# Patient Record
Sex: Male | Born: 1950 | Race: White | Hispanic: No | Marital: Married | Smoking: Never smoker
Health system: Southern US, Community
[De-identification: ages and names within clinical notes are randomized; demographics above are authoritative.]

## PROBLEM LIST (undated history)

## (undated) DIAGNOSIS — U071 COVID-19: Secondary | ICD-10-CM

## (undated) HISTORY — DX: COVID-19: U07.1

## (undated) HISTORY — PX: BACK SURGERY: SHX140

---

## 1999-06-10 ENCOUNTER — Encounter: Admission: RE | Admit: 1999-06-10 | Discharge: 1999-06-10 | Payer: Self-pay | Admitting: *Deleted

## 2000-08-19 ENCOUNTER — Ambulatory Visit (HOSPITAL_COMMUNITY): Admission: RE | Admit: 2000-08-19 | Discharge: 2000-08-19 | Payer: Self-pay | Admitting: Neurosurgery

## 2000-08-19 ENCOUNTER — Encounter: Payer: Self-pay | Admitting: Neurosurgery

## 2007-07-13 ENCOUNTER — Encounter: Admission: RE | Admit: 2007-07-13 | Discharge: 2007-07-13 | Payer: Self-pay | Admitting: General Surgery

## 2007-07-14 ENCOUNTER — Ambulatory Visit (HOSPITAL_BASED_OUTPATIENT_CLINIC_OR_DEPARTMENT_OTHER): Admission: RE | Admit: 2007-07-14 | Discharge: 2007-07-14 | Payer: Self-pay | Admitting: General Surgery

## 2010-06-09 NOTE — Op Note (Signed)
NAMEHERVE, HAUG            ACCOUNT NO.:  [id number]   MEDICAL RECORD NO.:  [id number]          PATIENT TYPE:  AMB   LOCATION:  DSC                          FACILITY:  [facility]   PHYSICIAN:  Cherylynn Ridges, M.D.    DATE OF BIRTH:  06-Dec-1950   DATE OF PROCEDURE:  07/14/2007  DATE OF DISCHARGE:                               OPERATIVE REPORT   PREOPERATIVE DIAGNOSIS:  Bilateral inguinal hernias.   POSTOPERATIVE DIAGNOSIS:  Bilateral direct inguinal hernias with small  indirect components.   PROCEDURE:  Bilateral inguinal hernia repair with mesh.   SURGEON:  Marta Lamas. Lindie Spruce, MD   ANESTHESIA:  General with laryngeal airway.   ESTIMATED BLOOD LOSS:  Less than 20 mL.   COMPLICATIONS:  None.   CONDITION:  Stable.   FINDINGS:  Bilateral significant direct hernias with small indirect  sacs.   INDICATIONS FOR OPERATION:  The patient is a 60 year old with  symptomatic bilateral hernias who comes in for repair.   OPERATION:  The patient was taken to the operating room, placed on table  in supine position.  After an adequate general laryngeal airway  anesthetic was administered, he was prepped and draped in usual sterile  manner exposing bilateral inguinal areas.   We started off on the patient's left side where we made a transverse  incision at the level of the superficial ring approximately 5-6 cm long.  It was taken down to the subcutaneous tissue through Scarpa fascia down  to the external oblique fascia which was split along its fibers through  the superficial ring.  We mobilized the spermatic cord up on a work  bench and as we were doing so we could palpate the hernia defect bulging  out medial to the cord.  We placed the cord up on to a work bench where  we inspected on the anterior medial aspect for an indirect sac and there  was a small component or probably at least 1 cm in size which was able  to be isolated from the cord.  We ligated that at its base with 2 suture  ligatures of 0 Ethibond.  We then repaired the floor by first  imbricating the hernia sac on itself, the direct hernia sac on itself  using 0 Ethibond sutures, and then placing an oval piece of mesh of  polypropylene mesh to the floor attaching into the pubic tubercle to  reflect the portion of the inguinal ligament inferolaterally and the  conjoined tendon anteromedially.   The mesh was sewn in place using a running stitch of 0 Prolene.  The  ilioinguinal nerve was preserved on both sides.  Once we had the mesh in  place which had been soaked in antibiotic solution, we irrigated with  antibiotic solution and then closed the external oblique fascia on top  of the cord using a running 3-0 Vicryl suture.  Scarpa fascia was  reapproximated using interrupted 3-0 Vicryl.  We then injected 0.5%  Marcaine without epinephrine into the subcu and then closed the skin  using running subcuticular stitch of 4-0 Monocryl.  All counts were  correct on  the left side and the right side was repaired in an identical  manner with an identical finding of a small indirect sac which was  suture ligated at its base with two 0 Ethibond sutures.  The piece of  mesh measured 5 x 2 cm in size and was sewn in place with 0 Prolene  suture.  We irrigated with antibiotic solution and soaked the mesh and  solution and then closed in a similar manner.  The skin was closed with  running subcuticular 4-0 Monocryl on both sides and we applied  Dermabond, Steri-Strips, and Tegaderm.  All needle counts, sponge  counts, and instrument counts were correct.      Cherylynn Ridges, M.D.  Electronically Signed     JOW/MEDQ  D:  07/14/2007  T:  07/15/2007  Job:  [id number]

## 2010-10-22 LAB — COMPREHENSIVE METABOLIC PANEL
ALT: 23
AST: 28
Albumin: 4.2
Alkaline Phosphatase: 94
BUN: 11
CO2: 26
Calcium: 9.3
Chloride: 104
Creatinine, Ser: 0.89
GFR calc Af Amer: >60
GFR calc non Af Amer: >60
Glucose, Bld: 120 — ABNORMAL HIGH
Potassium: 3.9
Sodium: 137
Total Bilirubin: 0.9
Total Protein: 7

## 2010-10-22 LAB — DIFFERENTIAL
Eosinophils Absolute: 0.1
Lymphs Abs: 3
Monocytes Relative: 8
Neutro Abs: 6.3
Neutrophils Relative %: 61

## 2010-10-22 LAB — CBC
HCT: 44
Hemoglobin: 15.1
MCHC: 34.2
MCV: 97.2
Platelets: 243
RBC: 4.52
RDW: 13.5
WBC: 10.3

## 2010-10-22 LAB — POCT HEMOGLOBIN-HEMACUE: Hemoglobin: 16.3

## 2012-11-27 ENCOUNTER — Other Ambulatory Visit: Payer: Self-pay | Admitting: Family Medicine

## 2012-11-27 DIAGNOSIS — M545 Low back pain, unspecified: Secondary | ICD-10-CM

## 2012-11-29 ENCOUNTER — Ambulatory Visit
Admission: RE | Admit: 2012-11-29 | Discharge: 2012-11-29 | Disposition: A | Discharge status: Home / Self Care | Payer: BC Managed Care – PPO | Source: Ambulatory Visit | Attending: Family Medicine | Admitting: Family Medicine

## 2012-11-29 DIAGNOSIS — M545 Low back pain: Secondary | ICD-10-CM

## 2016-03-09 ENCOUNTER — Encounter (HOSPITAL_COMMUNITY): Payer: Self-pay | Admitting: Emergency Medicine

## 2016-03-09 ENCOUNTER — Observation Stay (HOSPITAL_COMMUNITY)
Admission: EM | Admit: 2016-03-09 | Discharge: 2016-03-12 | Disposition: A | Discharge status: Home / Self Care | Payer: BLUE CROSS/BLUE SHIELD | Attending: Internal Medicine | Admitting: Internal Medicine

## 2016-03-09 DIAGNOSIS — D509 Iron deficiency anemia, unspecified: Secondary | ICD-10-CM | POA: Diagnosis not present

## 2016-03-09 DIAGNOSIS — M109 Gout, unspecified: Secondary | ICD-10-CM | POA: Insufficient documentation

## 2016-03-09 DIAGNOSIS — K2950 Unspecified chronic gastritis without bleeding: Secondary | ICD-10-CM | POA: Insufficient documentation

## 2016-03-09 DIAGNOSIS — G8929 Other chronic pain: Secondary | ICD-10-CM | POA: Insufficient documentation

## 2016-03-09 DIAGNOSIS — Z720 Tobacco use: Secondary | ICD-10-CM | POA: Insufficient documentation

## 2016-03-09 DIAGNOSIS — K3189 Other diseases of stomach and duodenum: Secondary | ICD-10-CM | POA: Diagnosis not present

## 2016-03-09 DIAGNOSIS — D649 Anemia, unspecified: Secondary | ICD-10-CM | POA: Diagnosis not present

## 2016-03-09 DIAGNOSIS — R634 Abnormal weight loss: Secondary | ICD-10-CM | POA: Diagnosis not present

## 2016-03-09 DIAGNOSIS — M549 Dorsalgia, unspecified: Secondary | ICD-10-CM | POA: Diagnosis not present

## 2016-03-09 DIAGNOSIS — Z791 Long term (current) use of non-steroidal anti-inflammatories (NSAID): Secondary | ICD-10-CM | POA: Insufficient documentation

## 2016-03-09 DIAGNOSIS — Z7982 Long term (current) use of aspirin: Secondary | ICD-10-CM | POA: Diagnosis not present

## 2016-03-09 DIAGNOSIS — M7989 Other specified soft tissue disorders: Secondary | ICD-10-CM | POA: Diagnosis not present

## 2016-03-09 LAB — CBC WITH DIFFERENTIAL/PLATELET
BASOS ABS: 0.1 10*3/uL (ref 0.0–0.1)
BASOS PCT: 1 %
EOS ABS: 1 10*3/uL — AB (ref 0.0–0.7)
EOS PCT: 10 %
HCT: 22.8 % — ABNORMAL LOW (ref 39.0–52.0)
Hemoglobin: 6.8 g/dL — CL (ref 13.0–17.0)
Lymphocytes Relative: 27 %
Lymphs Abs: 2.9 10*3/uL (ref 0.7–4.0)
MCH: 23.4 pg — ABNORMAL LOW (ref 26.0–34.0)
MCHC: 29.8 g/dL — ABNORMAL LOW (ref 30.0–36.0)
MCV: 78.4 fL (ref 78.0–100.0)
Monocytes Absolute: 0.9 10*3/uL (ref 0.1–1.0)
Monocytes Relative: 8 %
Neutro Abs: 5.7 10*3/uL (ref 1.7–7.7)
Neutrophils Relative %: 54 %
PLATELETS: 415 10*3/uL — AB (ref 150–400)
RBC: 2.91 MIL/uL — AB (ref 4.22–5.81)
RDW: 15 % (ref 11.5–15.5)
WBC: 10.4 10*3/uL (ref 4.0–10.5)

## 2016-03-09 LAB — COMPREHENSIVE METABOLIC PANEL
ALT: 20 U/L (ref 17–63)
AST: 26 U/L (ref 15–41)
Albumin: 2.7 g/dL — ABNORMAL LOW (ref 3.5–5.0)
Alkaline Phosphatase: 55 U/L (ref 38–126)
Anion gap: 4 — ABNORMAL LOW (ref 5–15)
BUN: 18 mg/dL (ref 6–20)
CHLORIDE: 109 mmol/L (ref 101–111)
CO2: 25 mmol/L (ref 22–32)
CREATININE: 0.81 mg/dL (ref 0.61–1.24)
Calcium: 8 mg/dL — ABNORMAL LOW (ref 8.9–10.3)
GFR calc Af Amer: >60 mL/min (ref >60)
GFR calc non Af Amer: >60 mL/min (ref >60)
Glucose, Bld: 109 mg/dL — ABNORMAL HIGH (ref 65–99)
Potassium: 3.9 mmol/L (ref 3.5–5.1)
SODIUM: 138 mmol/L (ref 135–145)
Total Bilirubin: 0.5 mg/dL (ref 0.3–1.2)
Total Protein: 4.6 g/dL — ABNORMAL LOW (ref 6.5–8.1)

## 2016-03-09 LAB — PREPARE RBC (CROSSMATCH)

## 2016-03-09 LAB — I-STAT TROPONIN, ED: TROPONIN I, POC: 0 ng/mL (ref 0.00–0.08)

## 2016-03-09 LAB — BRAIN NATRIURETIC PEPTIDE: B Natriuretic Peptide: 42.3 pg/mL (ref 0.0–100.0)

## 2016-03-09 LAB — ABO/RH: ABO/RH(D): O NEG

## 2016-03-09 LAB — POC OCCULT BLOOD, ED: Fecal Occult Bld: NEGATIVE

## 2016-03-09 MED ORDER — ACETAMINOPHEN 325 MG PO TABS: 650.0000 mg | ORAL_TABLET | Freq: Four times a day (QID) | ORAL | Status: DC | PRN

## 2016-03-09 MED ORDER — ALBUTEROL SULFATE (2.5 MG/3ML) 0.083% IN NEBU: 2.5000 mg | INHALATION_SOLUTION | RESPIRATORY_TRACT | Status: DC | PRN

## 2016-03-09 MED ORDER — ONDANSETRON HCL 4 MG/2ML IJ SOLN
4.0000 mg | Freq: Four times a day (QID) | INTRAMUSCULAR | Status: DC | PRN
  Filled 2016-03-09: qty 2

## 2016-03-09 MED ORDER — SODIUM CHLORIDE 0.9 % IV SOLN
Freq: Once | INTRAVENOUS | Status: AC
Start: 2016-03-09 — End: 2016-03-09
  Administered 2016-03-09: 18:00:00 via INTRAVENOUS

## 2016-03-09 MED ORDER — PANTOPRAZOLE SODIUM 40 MG PO TBEC
40.0000 mg | DELAYED_RELEASE_TABLET | Freq: Two times a day (BID) | ORAL | Status: DC
  Administered 2016-03-10 – 2016-03-12 (×5): 40 mg via ORAL
  Filled 2016-03-09 (×5): qty 1

## 2016-03-09 MED ORDER — ONDANSETRON HCL 4 MG PO TABS: 4.0000 mg | ORAL_TABLET | Freq: Four times a day (QID) | ORAL | Status: DC | PRN

## 2016-03-09 MED ORDER — TRAMADOL HCL 50 MG PO TABS
50.0000 mg | ORAL_TABLET | Freq: Four times a day (QID) | ORAL | Status: DC | PRN
  Administered 2016-03-11: 50 mg via ORAL
  Filled 2016-03-09: qty 1

## 2016-03-09 MED ORDER — ACETAMINOPHEN 650 MG RE SUPP: 650.0000 mg | Freq: Four times a day (QID) | RECTAL | Status: DC | PRN

## 2016-03-09 MED ORDER — PANTOPRAZOLE SODIUM 40 MG IV SOLR
40.0000 mg | Freq: Once | INTRAVENOUS | Status: AC
  Administered 2016-03-09: 40 mg via INTRAVENOUS
  Filled 2016-03-09: qty 40

## 2016-03-09 NOTE — ED Notes (Signed)
Pt transported to XR.  

## 2016-03-09 NOTE — ED Triage Notes (Addendum)
Pt st's he was seen by his MD yesterday for swelling and fatigue.  St's blood was drawn.  Pt st's he was called today and was told to come to ED ref. Low hgb, Pt st's he is not on any blood thinners but does take a [employer] everyday

## 2016-03-09 NOTE — Progress Notes (Signed)
Patient admitted from ED to room 6N17. Alert and oriented x4 .Denies pain. VS stable. Wife at bedside. Oriented to room and call bell.

## 2016-03-09 NOTE — ED Provider Notes (Signed)
[facility] DEPT Provider Note   CSN: [id number] Arrival date & time: 03/09/16  1631     History   Chief Complaint Chief Complaint  Patient presents with  . Abnormal Lab    HPI Erik Shields is a 66 y.o. male.  HPI Presents after incidentally found to have a low hemoglobin. Patient states he's been taking Goody powders for about 5 months almost every day for random pains including headaches or joint pains. He has no history of liver disease or alcoholism. He has not passed out but has been feeling short of breath for the last month. He denies any chest pain. Denies any rectal bleeding or black stool. Denies any hematuria. He is not on anticoagulation. Denies any abdominal pain. He does have a history of hemorrhoids but has not had trouble with this recently. Denies any significant cardiac history. Symptoms are not particularly worse today.  History reviewed. No pertinent past medical history.  Patient Active Problem List   Diagnosis Date Noted  . Symptomatic anemia 03/09/2016    Past Surgical History:  Procedure Laterality Date  . BACK SURGERY         Home Medications    Prior to Admission medications   Medication Sig Start Date End Date Taking? Authorizing Provider  aspirin EC 81 MG tablet Take 81 mg by mouth at bedtime.   Yes Historical Provider, MD  Aspirin-Acetaminophen-Caffeine (GOODY HEADACHE PO) Take 1 packet by mouth daily.    Yes Historical Provider, MD  diclofenac (VOLTAREN) 75 MG EC tablet Take 75 mg by mouth 2 (two) times daily as needed (for elbow pain).  01/09/16  Yes Historical Provider, MD  oxyCODONE-acetaminophen (PERCOCET) 10-325 MG tablet Take 0.5 tablets by mouth 4 (four) times daily as needed for pain.  02/08/16  Yes Historical Provider, MD  polyethylene glycol powder (GLYCOLAX/MIRALAX) powder Take 17 g by mouth at bedtime.   Yes Historical Provider, MD  traMADol (ULTRAM) 50 MG tablet Take 1 tablet by mouth 2 (two) times daily as needed (for  elbow pain).  02/17/16  Yes Historical Provider, MD    Family History No family history on file.  Social History Social History  Substance Use Topics  . Smoking status: Never Smoker  . Smokeless tobacco: Current User    Types: Chew  . Alcohol use No     Allergies   Patient has no known allergies.   Review of Systems Review of Systems  Constitutional: Negative for fever.  Genitourinary: Negative for hematuria.  Allergic/Immunologic: Negative for immunocompromised state.  All other systems reviewed and are negative.    Physical Exam Updated Vital Signs BP 116/73 (BP Location: Left Arm)   Pulse 69   Temp 98.2 F (36.8 C) (Oral)   Resp 16   Ht 5\' 9"  (1.753 m)   Wt 72.6 kg   SpO2 100%   BMI 23.63 kg/m   Physical Exam  Constitutional: He appears well-developed and well-nourished. No distress.  Very pale  HENT:  Head: Normocephalic and atraumatic.  Left Ear: External ear normal.  Eyes: Conjunctivae are normal. Pupils are equal, round, and reactive to light. Right eye exhibits no discharge. Left eye exhibits no discharge.  Neck: Normal range of motion. Neck supple.  Cardiovascular: Normal rate and regular rhythm.   No murmur heard. Pulmonary/Chest: Effort normal and breath sounds normal. No respiratory distress.  Abdominal: Soft. Bowel sounds are normal. He exhibits no distension and no mass. There is no tenderness. There is no rebound and no guarding.  Genitourinary: Rectum normal. Rectal exam shows guaiac negative stool.  Genitourinary Comments: Small stool sample that is light brown  Musculoskeletal: He exhibits no edema.  Neurological: He is alert.  Skin: Skin is warm. He is not diaphoretic.  Psychiatric: He has a normal mood and affect.     ED Treatments / Results  Labs (all labs ordered are listed, but only abnormal results are displayed) Labs Reviewed  CBC WITH DIFFERENTIAL/PLATELET - Abnormal; Notable for the following:       Result Value   RBC 2.91  (*)    Hemoglobin 6.8 (*)    HCT 22.8 (*)    MCH 23.4 (*)    MCHC 29.8 (*)    Platelets 415 (*)    Eosinophils Absolute 1.0 (*)    All other components within normal limits  COMPREHENSIVE METABOLIC PANEL - Abnormal; Notable for the following:    Glucose, Bld 109 (*)    Calcium 8.0 (*)    Total Protein 4.6 (*)    Albumin 2.7 (*)    Anion gap 4 (*)    All other components within normal limits  BRAIN NATRIURETIC PEPTIDE  CBC  BASIC METABOLIC PANEL  POC OCCULT BLOOD, ED  I-STAT TROPOININ, ED  TYPE AND SCREEN  PREPARE RBC (CROSSMATCH)  ABO/RH    EKG  EKG Interpretation  Date/Time:  Tuesday March 09 2016 18:28:54 EST Ventricular Rate:  64 PR Interval:    QRS Duration: 89 QT Interval:  406 QTC Calculation: 419 R Axis:   63 Text Interpretation:  Sinus rhythm No significant change since last tracing Confirmed by Ethelda ChickJACUBOWITZ  MD, SAM [id number]) on 03/09/2016 6:33:16 PM       Radiology No results found.  Procedures Procedures (including critical care time)  Medications Ordered in ED Medications  pantoprazole (PROTONIX) injection 40 mg (not administered)  ondansetron (ZOFRAN) tablet 4 mg (not administered)    Or  ondansetron (ZOFRAN) injection 4 mg (not administered)  acetaminophen (TYLENOL) tablet 650 mg (not administered)    Or  acetaminophen (TYLENOL) suppository 650 mg (not administered)  traMADol (ULTRAM) tablet 50 mg (not administered)  albuterol (PROVENTIL) (2.5 MG/3ML) 0.083% nebulizer solution 2.5 mg (not administered)  pantoprazole (PROTONIX) EC tablet 40 mg (not administered)  0.9 %  sodium chloride infusion ( Intravenous Stopped 03/09/16 1938)     Initial Impression / Assessment and Plan / ED Course  I have reviewed the triage vital signs and the nursing notes.  Pertinent labs & imaging results that were available during my care of the patient were reviewed by me and considered in my medical decision making (see chart for details).     Patient appears  to have a new onset of critical anemia. Transfusion ordered. EKG, troponin without evidence of ACS. BMP negative. Point of care Hemoccult is negative with adequate stool sample but possible false negative. Patient is not any anticoagulation. Will admit patient for further evaluation and monitoring.  Final Clinical Impressions(s) / ED Diagnoses   Final diagnoses:  None  Symptomatic Anemia  New Prescriptions Current Discharge Medication List       Sidney AceAlison Charruf Tamara Monteith, MD 03/09/16 [id number]    Doug SouSam Jacubowitz, MD 03/10/16 [id number]

## 2016-03-09 NOTE — ED Notes (Signed)
CRITICAL VALUE ALERT  Critical value received:  Hgb 6.8  Date of notification:  03/09/2016   Time of notification: 1705  Critical value read back:Yes.    Nurse who received alert:  Heide GuileHope Jamilla Galli  MD notified (1st page):  Dr. Patria Maneampos Pt moved back to room   Time of first page:  1707

## 2016-03-09 NOTE — H&P (Signed)
History and Physical    Erik Shields ZOX:[id number] DOB: 1950-02-03 DOA: 03/09/2016  Referring MD/NP/PA: Dr. Franklyn Loruch [facility]  Patient coming from: Home  Chief Complaint: abnormal lab work  HPI: Erik CalClarence E Shields is a 66 y.o. male with medical history significant of  DDD, gout, and chronic back pain; who presents for abnormal lab work. For the last 1 month patient notes that he's been easily fatigued with even minimal exertion. For the last week she has had associated symptoms of lower leg swelling. Associated symptoms include shortness of breath. Seen by primary care doctor yesterday for these symptoms and blood work was taken. The PCP office called him today for him to come to the emergency department to be transfused 2 units of blood as his hemoglobin was noted to be in the 6.9 g/dL. He admits to daily use of Goody powders reporting anywhere from 1-2 packets per day for at least the past 5 months due to back and joint pain. Patient denies any bloody stools, loss of consciousness, nausea, vomiting, diarrhea, abdominal pain, chest pain, or cough. Patient notes that he's had a colonoscopy with in the last year [place] [address] He thinks he was told that he had diverticulosis.   ED Course: Admission to the emergency department patient was seen to have vitals relatively within normal limits. Lab work revealed a hemoglobin of 6.8. Patient was ordered to transfuse 1 unit of packed red blood cells. [facility] called to admit.  Review of Systems: As per HPI otherwise 10 point review of systems negative.   History reviewed. No pertinent past medical history.  Past Surgical History:  Procedure Laterality Date  . BACK SURGERY       reports that he has never smoked. His smokeless tobacco use includes Chew. He reports that he does not drink alcohol or use drugs.  Allergies not on file  No family history on file.  Prior to Admission medications     Not on File    Physical Exam:   Constitutional: NAD, calm, comfortable Vitals:   03/09/16 1647 03/09/16 1648 03/09/16 1821 03/09/16 1822  BP: 116/73  115/75   Pulse: 69   63  Resp: 16     Temp: 98.2 F (36.8 C)     TempSrc: Oral     SpO2: 100%   100%  Weight:  72.6 kg (160 lb)    Height:  5\' 9"  (1.753 m)     Eyes: PERRL, lids and conjunctivae normal ENMT: Mucous membranes are moist. Posterior pharynx clear of any exudate or lesions.Normal dentition.  Neck: normal, supple, no masses, no thyromegaly Respiratory: clear to auscultation bilaterally, no wheezing, no crackles. Normal respiratory effort. No accessory muscle use.  Cardiovascular: Regular rate and rhythm, no murmurs / rubs / gallops. No extremity edema. 2+ pedal pulses. No carotid bruits.  Abdomen: no tenderness, no masses palpated. No hepatosplenomegaly. Bowel sounds positive.  Musculoskeletal: no clubbing / cyanosis. No joint deformity upper and lower extremities. Good ROM, no contractures. Normal muscle tone.  Skin: no rashes, lesions, ulcers. No induration Neurologic: CN 2-12 grossly intact. Sensation intact, DTR normal. Strength 5/5 in all 4.  Psychiatric: Normal judgment and insight. Alert and oriented x 3. Normal mood.     Labs on Admission: I have personally reviewed following labs and imaging studies  CBC:  Recent Labs Lab 03/09/16 1651  WBC 10.4  NEUTROABS 5.7  HGB 6.8*  HCT 22.8*  MCV 78.4  PLT 415*  Basic Metabolic Panel:  Recent Labs Lab 03/09/16 1651  NA 138  K 3.9  CL 109  CO2 25  GLUCOSE 109*  BUN 18  CREATININE 0.81  CALCIUM 8.0*   GFR: Estimated Creatinine Clearance: 89.7 mL/min (by C-G formula based on SCr of 0.81 mg/dL). Liver Function Tests:  Recent Labs Lab 03/09/16 1651  AST 26  ALT 20  ALKPHOS 55  BILITOT 0.5  PROT 4.6*  ALBUMIN 2.7*   No results for input(s): LIPASE, AMYLASE in the last 168 hours. No results for input(s): AMMONIA in the last 168  hours. Coagulation Profile: No results for input(s): INR, PROTIME in the last 168 hours. Cardiac Enzymes: No results for input(s): CKTOTAL, CKMB, CKMBINDEX, TROPONINI in the last 168 hours. BNP (last 3 results) No results for input(s): PROBNP in the last 8760 hours. HbA1C: No results for input(s): HGBA1C in the last 72 hours. CBG: No results for input(s): GLUCAP in the last 168 hours. Lipid Profile: No results for input(s): CHOL, HDL, LDLCALC, TRIG, CHOLHDL, LDLDIRECT in the last 72 hours. Thyroid Function Tests: No results for input(s): TSH, T4TOTAL, FREET4, T3FREE, THYROIDAB in the last 72 hours. Anemia Panel: No results for input(s): VITAMINB12, FOLATE, FERRITIN, TIBC, IRON, RETICCTPCT in the last 72 hours. Urine analysis: No results found for: COLORURINE, APPEARANCEUR, LABSPEC, PHURINE, GLUCOSEU, HGBUR, BILIRUBINUR, KETONESUR, PROTEINUR, UROBILINOGEN, NITRITE, LEUKOCYTESUR Sepsis Labs: No results found for this or any previous visit (from the past 240 hour(s)).   Radiological Exams on Admission: No results found.  EKG: Independently reviewed. Sinus rhythm  Assessment/Plan Symptomatic anemia/GI bleed: Acute. Patient intial hemoglobin noted to be 6.8 on admission. Guiac stool negative. Patient does not appear to be actively bleeding at this time. He does reports history of diverticulosis and daily BC goody powder use. - Admit to a MedSurg bed - Continue with transfusion 1unit of PRBC  - Clear liquid diet - Recheck CBC in a.m  - Counseled on the need of the patient to refrain away from NSAIDs use - Determine if patient needs inpatient GI consultation in a.m.  Lower leg swelling: Bilaterally. Symptoms improved with elevation of lower extremities. - Consider need of compression stockings. DVT prophylaxis: SCD  Code Status: Full Family Communication: Discussed plan of care with the patient and family present at bedside Disposition Plan: Likely discharge home in a.m.   Consults  called: none Admission status: Observation  Clydie Braun MD [facility] Pager [phone]  If 7PM-7AM, please contact night-coverage [email or web address] Password [facility]  03/09/2016, 7:29 PM

## 2016-03-09 NOTE — ED Provider Notes (Signed)
Patient reports he has been increasingly tired and generalized weakness for the past 3 weeks. No definite hemoglobin of 6.8. Saw his primary care physician yesterday. Sent here for further evaluation patient is alert and in no distress lungs clear auscultation heart regular rate and rhythm abdomen nondistended nontender extremities without edema   Doug SouSam Levii [place], MD 03/10/16 [id number]

## 2016-03-10 DIAGNOSIS — D649 Anemia, unspecified: Secondary | ICD-10-CM

## 2016-03-10 DIAGNOSIS — M7989 Other specified soft tissue disorders: Secondary | ICD-10-CM | POA: Diagnosis not present

## 2016-03-10 LAB — FERRITIN: FERRITIN: 3 ng/mL — AB (ref 24–336)

## 2016-03-10 LAB — BASIC METABOLIC PANEL
ANION GAP: 5 (ref 5–15)
BUN: 10 mg/dL (ref 6–20)
CALCIUM: 8.1 mg/dL — AB (ref 8.9–10.3)
CO2: 25 mmol/L (ref 22–32)
Chloride: 112 mmol/L — ABNORMAL HIGH (ref 101–111)
Creatinine, Ser: 0.81 mg/dL (ref 0.61–1.24)
GFR calc Af Amer: >60 mL/min (ref >60)
GLUCOSE: 96 mg/dL (ref 65–99)
Potassium: 4 mmol/L (ref 3.5–5.1)
SODIUM: 142 mmol/L (ref 135–145)

## 2016-03-10 LAB — HEMOGLOBIN AND HEMATOCRIT, BLOOD
HCT: 26.1 % — ABNORMAL LOW (ref 39.0–52.0)
HCT: 29.8 % — ABNORMAL LOW (ref 39.0–52.0)
HEMOGLOBIN: 8.2 g/dL — AB (ref 13.0–17.0)
HEMOGLOBIN: 9.1 g/dL — AB (ref 13.0–17.0)

## 2016-03-10 LAB — CBC
HCT: 25.4 % — ABNORMAL LOW (ref 39.0–52.0)
Hemoglobin: 7.7 g/dL — ABNORMAL LOW (ref 13.0–17.0)
MCH: 24.1 pg — ABNORMAL LOW (ref 26.0–34.0)
MCHC: 30.3 g/dL (ref 30.0–36.0)
MCV: 79.6 fL (ref 78.0–100.0)
PLATELETS: 316 10*3/uL (ref 150–400)
RBC: 3.19 MIL/uL — ABNORMAL LOW (ref 4.22–5.81)
RDW: 15.2 % (ref 11.5–15.5)
WBC: 6.8 10*3/uL (ref 4.0–10.5)

## 2016-03-10 LAB — IRON AND TIBC
IRON: 52 ug/dL (ref 45–182)
Saturation Ratios: 14 % — ABNORMAL LOW (ref 17.9–39.5)
TIBC: 378 ug/dL (ref 250–450)
UIBC: 326 ug/dL

## 2016-03-10 LAB — LACTATE DEHYDROGENASE: LDH: 162 U/L (ref 98–192)

## 2016-03-10 LAB — RETICULOCYTES
RBC.: 3.28 MIL/uL — AB (ref 4.22–5.81)
RETIC CT PCT: 1.3 % (ref 0.4–3.1)
Retic Count, Absolute: 42.6 10*3/uL (ref 19.0–186.0)

## 2016-03-10 LAB — PREPARE RBC (CROSSMATCH)

## 2016-03-10 LAB — VITAMIN B12: VITAMIN B 12: 315 pg/mL (ref 180–914)

## 2016-03-10 LAB — FOLATE: Folate: 21 ng/mL (ref >5.9)

## 2016-03-10 MED ORDER — SODIUM CHLORIDE 0.9 % IV SOLN
Freq: Once | INTRAVENOUS | Status: AC
  Administered 2016-03-10: 10:00:00 via INTRAVENOUS

## 2016-03-10 NOTE — Progress Notes (Signed)
[facility]                                                                              Patient Demographics  Erik Shields, is a 66 y.o. male, DOB - [birth year], ZOX:[id number]  Admit date - 03/09/2016   Admitting Physician Clydie Braun, MD  Outpatient Primary MD for the patient is [facility]  Outpatient specialists:   LOS - 0  days    Chief Complaint  Patient presents with  . Abnormal Lab       Brief summary   Patient is a 66 year old male with history of DDD, gout, and chronic back pain presented to ED with abnormal lab work. Patient noted that for the last 1 month he was being easily fatigued with minimal exertion, lower leg swelling, dyspnea with exertion. Patient was seen by PCP and was called with a hemoglobin of 6.9 and was recommended to go to ED. Patient admitted to daily use of Goody powders 1-2 packets every day for the past 5 months with a daily aspirin. Hemoglobin was 6.8.   Assessment & Plan    Principal Problem:   Symptomatic anemia:Hemoglobin 6.8 at the time of admission - FOBT negative, - Status post 1 unit packed RBCs, hemoglobin 7.7. Colonoscopy last year per patient showed diverticulosis - Also reports more than 20 pounds weight loss, chronic abdominal pain for past 2-3 months, daily goody powder use, "pain pills" - Anemia panel showed iron 52, ferritin 3, LDH heptoglobin pending - GI consultation obtained, discussed with Dr. Elnoria Howard, will follow recommendations, currently on clear liquid diet - Continue PPI    Active Problems:   Swelling of lower extremity - Improved with elevation of the lower extremities, also third spacing and hypo-albuminemia with albumin of 2.7   Code Status: Full code  DVT Prophylaxis:  SCD's Family Communication: Discussed in detail with the patient, all imaging results, lab results explained to the patient  and wife  Disposition Plan: Pending GI evaluation  Time Spent  in minutes  25 minutes  Procedures:    Consultants:   GI, Dr Elnoria Howard   Antimicrobials:      Medications  Scheduled Meds: . sodium chloride   Intravenous Once  . pantoprazole  40 mg Oral BID   Continuous Infusions: PRN Meds:.acetaminophen **OR** acetaminophen, albuterol, ondansetron **OR** ondansetron (ZOFRAN) IV, traMADol   Antibiotics   Anti-infectives    None        Subjective:   Erik Shields was seen and examined today. Currently denies any GI bleeding, hematochezia or melena.  Patient denies dizziness, chest pain, shortness of breath, abdominal pain, N/V/D/C, new weakness, numbess, tingling. No acute events overnight.    Objective:   Vitals:   03/09/16 2045 03/09/16 2105 03/09/16 2315 03/10/16 0601  BP: 137/81 127/80 (!) 149/82 129/79  Pulse:  72 (!) 52 72  Resp:  19 18 19   Temp:  98.2 F (36.8 C) 98 F (36.7 C) 98.2 F (36.8 C)  TempSrc:  Oral Oral Oral  SpO2:  100% 100% 100%  Weight:      Height:  Intake/Output Summary (Last 24 hours) at 03/10/16 1222 Last data filed at 03/09/16 2315  Gross per 24 hour  Intake             1670 ml  Output                0 ml  Net             1670 ml     Wt Readings from Last 3 Encounters:  03/09/16 72.6 kg (160 lb)     Exam  General: Alert and oriented x 3, NAD  HEENT:  PERRLA, EOMI, Anicteric Sclera, mucous membranes moist.   Neck: Supple, no JVD, no masses  Cardiovascular: S1 S2 auscultated, no rubs, murmurs or gallops. Regular rate and rhythm.  Respiratory: Clear to auscultation bilaterally, no wheezing, rales or rhonchi  Gastrointestinal: Soft, nontender, nondistended, + bowel sounds  Ext: no cyanosis clubbing or edema  Neuro: AAOx3, Cr N's II- XII. Strength 5/5 upper and lower extremities bilaterally  Skin: No rashes  Psych: Normal affect and demeanor, alert and oriented x3    Data Reviewed:  I have personally reviewed following labs and imaging studies  Micro Results No  results found for this or any previous visit (from the past 240 hour(s)).  Radiology Reports No results found.  Lab Data:  CBC:  Recent Labs Lab 03/09/16 1651 03/10/16 0534  WBC 10.4 6.8  NEUTROABS 5.7  --   HGB 6.8* 7.7*  HCT 22.8* 25.4*  MCV 78.4 79.6  PLT 415* 316   Basic Metabolic Panel:  Recent Labs Lab 03/09/16 1651 03/10/16 0534  NA 138 142  K 3.9 4.0  CL 109 112*  CO2 25 25  GLUCOSE 109* 96  BUN 18 10  CREATININE 0.81 0.81  CALCIUM 8.0* 8.1*   GFR: Estimated Creatinine Clearance: 89.7 mL/min (by C-G formula based on SCr of 0.81 mg/dL). Liver Function Tests:  Recent Labs Lab 03/09/16 1651  AST 26  ALT 20  ALKPHOS 55  BILITOT 0.5  PROT 4.6*  ALBUMIN 2.7*   No results for input(s): LIPASE, AMYLASE in the last 168 hours. No results for input(s): AMMONIA in the last 168 hours. Coagulation Profile: No results for input(s): INR, PROTIME in the last 168 hours. Cardiac Enzymes: No results for input(s): CKTOTAL, CKMB, CKMBINDEX, TROPONINI in the last 168 hours. BNP (last 3 results) No results for input(s): PROBNP in the last 8760 hours. HbA1C: No results for input(s): HGBA1C in the last 72 hours. CBG: No results for input(s): GLUCAP in the last 168 hours. Lipid Profile: No results for input(s): CHOL, HDL, LDLCALC, TRIG, CHOLHDL, LDLDIRECT in the last 72 hours. Thyroid Function Tests: No results for input(s): TSH, T4TOTAL, FREET4, T3FREE, THYROIDAB in the last 72 hours. Anemia Panel:  Recent Labs  03/10/16 0648  VITAMINB12 315  FOLATE 21.0  FERRITIN 3*  TIBC 378  IRON 52  RETICCTPCT 1.3   Urine analysis: No results found for: COLORURINE, APPEARANCEUR, LABSPEC, PHURINE, GLUCOSEU, HGBUR, BILIRUBINUR, KETONESUR, PROTEINUR, UROBILINOGEN, NITRITE, [place] PartyLEUKOCYTESUR   Everlena Mackley M.D. [facility] 03/10/2016, 12:22 PM  Pager: [phone] Between 7am to 7pm - call Pager - [phone]  After 7pm go to [email or web address] - password [email or web address]  Call  night coverage person covering after 7pm

## 2016-03-10 NOTE — Consult Note (Addendum)
Reason for Consult: Symptomatic anemia Referring Physician: [facility]  Lenn Cal HPI: The patient was evaluated by his PCP for complaints of fatigue with minor exertion.  Blood work revealed that his HGB was at 6.9 g/dL and he was instructed to go to the ER for further evaluation and transfusion.  He states that he was using Goody's Powder for the past 5 months for back pain, but he denied any issues with hematochezia, melena, or hematemesis.  A colonoscopy was performed at the office on 07/03/2015 with findings of left sided diverticula.  His wife states that he has lost 20 lbs since last summer.  She reports that they reported this issue to his PCP, but no further work up was pursued.  Currently the patient minimizes his symptoms of weight loss and abdominal pain.  He does report some upper abdominal pain associated with PO intake.    History reviewed. No pertinent past medical history.  Past Surgical History:  Procedure Laterality Date  . BACK SURGERY      No family history on file.  Social History:  reports that he has never smoked. His smokeless tobacco use includes Chew. He reports that he does not drink alcohol or use drugs.  Allergies: No Known Allergies  Medications:  Scheduled: . sodium chloride   Intravenous Once  . pantoprazole  40 mg Oral BID   Continuous:   Results for orders placed or performed during the hospital encounter of 03/09/16 (from the past 24 hour(s))  CBC with Differential     Status: Abnormal   Collection Time: 03/09/16  4:51 PM  Result Value Ref Range   WBC 10.4 4.0 - 10.5 K/uL   RBC 2.91 (L) 4.22 - 5.81 MIL/uL   Hemoglobin 6.8 (LL) 13.0 - 17.0 g/dL   HCT 40.9 (L) 81.1 - 91.4 %   MCV 78.4 78.0 - 100.0 fL   MCH 23.4 (L) 26.0 - 34.0 pg   MCHC 29.8 (L) 30.0 - 36.0 g/dL   RDW 78.2 95.6 - 21.3 %   Platelets 415 (H) 150 - 400 K/uL   Neutrophils Relative % 54 %   Neutro Abs 5.7 1.7 - 7.7 K/uL   Lymphocytes Relative 27 %   Lymphs Abs 2.9  0.7 - 4.0 K/uL   Monocytes Relative 8 %   Monocytes Absolute 0.9 0.1 - 1.0 K/uL   Eosinophils Relative 10 %   Eosinophils Absolute 1.0 (H) 0.0 - 0.7 K/uL   Basophils Relative 1 %   Basophils Absolute 0.1 0.0 - 0.1 K/uL  Comprehensive metabolic panel     Status: Abnormal   Collection Time: 03/09/16  4:51 PM  Result Value Ref Range   Sodium 138 135 - 145 mmol/L   Potassium 3.9 3.5 - 5.1 mmol/L   Chloride 109 101 - 111 mmol/L   CO2 25 22 - 32 mmol/L   Glucose, Bld 109 (H) 65 - 99 mg/dL   BUN 18 6 - 20 mg/dL   Creatinine, Ser 0.86 0.61 - 1.24 mg/dL   Calcium 8.0 (L) 8.9 - 10.3 mg/dL   Total Protein 4.6 (L) 6.5 - 8.1 g/dL   Albumin 2.7 (L) 3.5 - 5.0 g/dL   AST 26 15 - 41 U/L   ALT 20 17 - 63 U/L   Alkaline Phosphatase 55 38 - 126 U/L   Total Bilirubin 0.5 0.3 - 1.2 mg/dL   GFR calc non Af Amer >60 >60 mL/min   GFR calc Af Amer >60 >60 mL/min  Anion gap 4 (L) 5 - 15  Type and screen [facility]     Status: None (Preliminary result)   Collection Time: 03/09/16  5:54 PM  Result Value Ref Range   ABO/RH(D) O NEG    Antibody Screen NEG    Sample Expiration 03/12/2016    Unit Number [id number]    Blood Component Type RED CELLS,LR    Unit division 00    Status of Unit ISSUED,FINAL    Transfusion Status OK TO TRANSFUSE    Crossmatch Result Compatible    Unit Number [id number]    Blood Component Type RED CELLS,LR    Unit division 00    Status of Unit ALLOCATED    Transfusion Status OK TO TRANSFUSE    Crossmatch Result Compatible   Prepare RBC     Status: None   Collection Time: 03/09/16  5:54 PM  Result Value Ref Range   Order Confirmation ORDER PROCESSED BY BLOOD BANK   ABO/Rh     Status: None   Collection Time: 03/09/16  5:54 PM  Result Value Ref Range   ABO/RH(D) O NEG   POC occult blood, ED     Status: None   Collection Time: 03/09/16  6:04 PM  Result Value Ref Range   Fecal Occult Bld NEGATIVE NEGATIVE  Brain natriuretic peptide     Status: None    Collection Time: 03/09/16  7:15 PM  Result Value Ref Range   B Natriuretic Peptide 42.3 0.0 - 100.0 pg/mL  I-Stat Troponin, ED ([facility])     Status: None   Collection Time: 03/09/16  7:40 PM  Result Value Ref Range   Troponin i, poc 0.00 0.00 - 0.08 ng/mL   Comment 3          CBC     Status: Abnormal   Collection Time: 03/10/16  5:34 AM  Result Value Ref Range   WBC 6.8 4.0 - 10.5 K/uL   RBC 3.19 (L) 4.22 - 5.81 MIL/uL   Hemoglobin 7.7 (L) 13.0 - 17.0 g/dL   HCT 30.825.4 (L) 65.739.0 - 84.652.0 %   MCV 79.6 78.0 - 100.0 fL   MCH 24.1 (L) 26.0 - 34.0 pg   MCHC 30.3 30.0 - 36.0 g/dL   RDW 96.215.2 95.211.5 - 84.115.5 %   Platelets 316 150 - 400 K/uL  Basic metabolic panel     Status: Abnormal   Collection Time: 03/10/16  5:34 AM  Result Value Ref Range   Sodium 142 135 - 145 mmol/L   Potassium 4.0 3.5 - 5.1 mmol/L   Chloride 112 (H) 101 - 111 mmol/L   CO2 25 22 - 32 mmol/L   Glucose, Bld 96 65 - 99 mg/dL   BUN 10 6 - 20 mg/dL   Creatinine, Ser 3.240.81 0.61 - 1.24 mg/dL   Calcium 8.1 (L) 8.9 - 10.3 mg/dL   GFR calc non Af Amer >60 >60 mL/min   GFR calc Af Amer >60 >60 mL/min   Anion gap 5 5 - 15  Ferritin     Status: Abnormal   Collection Time: 03/10/16  6:48 AM  Result Value Ref Range   Ferritin 3 (L) 24 - 336 ng/mL  Folate     Status: None   Collection Time: 03/10/16  6:48 AM  Result Value Ref Range   Folate 21.0 >5.9 ng/mL  Iron and TIBC     Status: Abnormal   Collection Time: 03/10/16  6:48 AM  Result  Value Ref Range   Iron 52 45 - 182 ug/dL   TIBC 161 096 - 045 ug/dL   Saturation Ratios 14 (L) 17.9 - 39.5 %   UIBC 326 ug/dL  Reticulocytes     Status: Abnormal   Collection Time: 03/10/16  6:48 AM  Result Value Ref Range   Retic Ct Pct 1.3 0.4 - 3.1 %   RBC. 3.28 (L) 4.22 - 5.81 MIL/uL   Retic Count, Manual 42.6 19.0 - 186.0 K/uL  Vitamin B12     Status: None   Collection Time: 03/10/16  6:48 AM  Result Value Ref Range   Vitamin B-12 315 180 - 914 pg/mL  Prepare RBC     Status:  None   Collection Time: 03/10/16  9:24 AM  Result Value Ref Range   Order Confirmation ORDER PROCESSED BY BLOOD BANK      No results found.  ROS:  As stated above in the HPI otherwise negative.  Blood pressure 129/79, pulse 72, temperature 98.2 F (36.8 C), temperature source Oral, resp. rate 19, height 5\' 9"  (1.753 m), weight 72.6 kg (160 lb), SpO2 100 %.    PE: Gen: NAD, Alert and Oriented HEENT:  [place]/AT, EOMI Neck: Supple, no LAD Lungs: CTA Bilaterally CV: RRR without M/G/R ABM: Soft, NTND, +BS Ext: No C/C/E  Assessment/Plan: 1) Anemia. 2) NSAID use. 3) Back pain. 4) Weight loss.   With his use of NSAIDs on a consistent basis, his anemia, and weight loss, I will pursue an EGD for further evaluation.    Plan: 1) EGD - Friday at 7:30 AM.  (Scheduling conflicts). 2) Follow HGB. 3) Continue with PPI.  Lisel Siegrist D 03/10/2016, 11:22 AM

## 2016-03-10 NOTE — Progress Notes (Signed)
Pt. States he feels fine/has no complaints since blood transfusion started. Will continue to monitor.

## 2016-03-11 DIAGNOSIS — M7989 Other specified soft tissue disorders: Secondary | ICD-10-CM | POA: Diagnosis not present

## 2016-03-11 DIAGNOSIS — D649 Anemia, unspecified: Secondary | ICD-10-CM | POA: Diagnosis not present

## 2016-03-11 LAB — TYPE AND SCREEN
ABO/RH(D): O NEG
ANTIBODY SCREEN: NEGATIVE
UNIT DIVISION: 0
UNIT DIVISION: 0

## 2016-03-11 LAB — HEMOGLOBIN AND HEMATOCRIT, BLOOD
HCT: 30.2 % — ABNORMAL LOW (ref 39.0–52.0)
Hemoglobin: 9.5 g/dL — ABNORMAL LOW (ref 13.0–17.0)

## 2016-03-11 LAB — HAPTOGLOBIN: Haptoglobin: 135 mg/dL (ref 34–200)

## 2016-03-11 MED ORDER — FERROUS GLUCONATE 324 (38 FE) MG PO TABS
324.0000 mg | ORAL_TABLET | Freq: Every day | ORAL | Status: DC
  Administered 2016-03-11 – 2016-03-12 (×2): 324 mg via ORAL
  Filled 2016-03-11 (×2): qty 1

## 2016-03-11 NOTE — Progress Notes (Signed)
[facility]                                                                              Patient Demographics  Erik Shields, is a 66 y.o. male, DOB - 05-21-1950, ZOX:[id number]  Admit date - 03/09/2016   Admitting Physician Clydie Braun, MD  Outpatient Primary MD for the patient is [facility]  Outpatient specialists:   LOS - 0  days    Chief Complaint  Patient presents with  . Abnormal Lab       Brief summary   Patient is a 66 year old male with history of DDD, gout, and chronic back pain presented to ED with abnormal lab work. Patient noted that for the last 1 month he was being easily fatigued with minimal exertion, lower leg swelling, dyspnea with exertion. Patient was seen by PCP and was called with a hemoglobin of 6.9 and was recommended to go to ED. Patient admitted to daily use of Goody powders 1-2 packets every day for the past 5 months with a daily aspirin. Hemoglobin was 6.8.   Assessment & Plan    Principal Problem:   Symptomatic anemia:Hemoglobin 6.8 at the time of admission - FOBT negative, - Status post 2 unit packed RBCs, hemoglobin 9.5. Colonoscopy last year per patient showed diverticulosis - Also reports more than 20 pounds weight loss, chronic abdominal pain for past 2-3 months, daily goody powder use, "pain pills" - Anemia panel showed iron 52, ferritin 3, LDH 162, haptoglobin 135 - GI consultation obtained, EGD planned 2/16. currently on clear liquid diet. NPO in am  - Continue PPI    Active Problems:   Swelling of lower extremity - Improved with elevation of the lower extremities, also third spacing and hypo-albuminemia with albumin of 2.7  Iron deficiency anemia - place on iron supplementation   Code Status: Full code  DVT Prophylaxis:  SCD's Family Communication: Discussed in detail with the patient, all imaging results, lab results explained to the patient  and wife  Disposition Plan:  Pending EGD Time Spent in minutes  25 minutes  Procedures:    Consultants:   GI, Dr Elnoria Howard   Antimicrobials:      Medications  Scheduled Meds: . pantoprazole  40 mg Oral BID   Continuous Infusions: PRN Meds:.acetaminophen **OR** acetaminophen, albuterol, ondansetron **OR** ondansetron (ZOFRAN) IV, traMADol   Antibiotics   Anti-infectives    None        Subjective:   Erik Shields was seen and examined today. No GI bleeding currently. No abdominal pain nausea vomiting.  Patient denies dizziness, chest pain, shortness of breath, new weakness, numbess, tingling. No acute events overnight.    Objective:   Vitals:   03/10/16 1318 03/10/16 1629 03/10/16 2226 03/11/16 0535  BP: 131/67 126/84 122/71 131/80  Pulse: 68 64 75 76  Resp: 16 16 17 17   Temp: 98.4 F (36.9 C) 98.5 F (36.9 C) 99.1 F (37.3 C) 98.9 F (37.2 C)  TempSrc: Oral Oral Oral Oral  SpO2: 100% 100% 99% 99%  Weight:      Height:        Intake/Output  Summary (Last 24 hours) at 03/11/16 1306 Last data filed at 03/11/16 0855  Gross per 24 hour  Intake             1235 ml  Output                0 ml  Net             1235 ml     Wt Readings from Last 3 Encounters:  03/09/16 72.6 kg (160 lb)     Exam  General: Alert and oriented x 3, NAD  HEENT:    Neck:   Cardiovascular: S1 S2 auscultated, no rubs, murmurs or gallops. Regular rate and rhythm.  Respiratory: Clear to auscultation bilaterally, no wheezing, rales or rhonchi  Gastrointestinal: Soft, nontender, nondistended, + bowel sounds  Ext: no cyanosis clubbing or edema  Neuro: No new deficits  Skin: No rashes  Psych: Normal affect and demeanor, alert and oriented x3    Data Reviewed:  I have personally reviewed following labs and imaging studies  Micro Results No results found for this or any previous visit (from the past 240 hour(s)).  Radiology Reports No results found.  Lab Data:  CBC:  Recent Labs Lab  03/09/16 1651 03/10/16 0534 03/10/16 1230 03/10/16 1808 03/11/16 0436  WBC 10.4 6.8  --   --   --   NEUTROABS 5.7  --   --   --   --   HGB 6.8* 7.7* 8.2* 9.1* 9.5*  HCT 22.8* 25.4* 26.1* 29.8* 30.2*  MCV 78.4 79.6  --   --   --   PLT 415* 316  --   --   --    Basic Metabolic Panel:  Recent Labs Lab 03/09/16 1651 03/10/16 0534  NA 138 142  K 3.9 4.0  CL 109 112*  CO2 25 25  GLUCOSE 109* 96  BUN 18 10  CREATININE 0.81 0.81  CALCIUM 8.0* 8.1*   GFR: Estimated Creatinine Clearance: 89.7 mL/min (by C-G formula based on SCr of 0.81 mg/dL). Liver Function Tests:  Recent Labs Lab 03/09/16 1651  AST 26  ALT 20  ALKPHOS 55  BILITOT 0.5  PROT 4.6*  ALBUMIN 2.7*   No results for input(s): LIPASE, AMYLASE in the last 168 hours. No results for input(s): AMMONIA in the last 168 hours. Coagulation Profile: No results for input(s): INR, PROTIME in the last 168 hours. Cardiac Enzymes: No results for input(s): CKTOTAL, CKMB, CKMBINDEX, TROPONINI in the last 168 hours. BNP (last 3 results) No results for input(s): PROBNP in the last 8760 hours. HbA1C: No results for input(s): HGBA1C in the last 72 hours. CBG: No results for input(s): GLUCAP in the last 168 hours. Lipid Profile: No results for input(s): CHOL, HDL, LDLCALC, TRIG, CHOLHDL, LDLDIRECT in the last 72 hours. Thyroid Function Tests: No results for input(s): TSH, T4TOTAL, FREET4, T3FREE, THYROIDAB in the last 72 hours. Anemia Panel:  Recent Labs  03/10/16 0648  VITAMINB12 315  FOLATE 21.0  FERRITIN 3*  TIBC 378  IRON 52  RETICCTPCT 1.3   Urine analysis: No results found for: COLORURINE, APPEARANCEUR, LABSPEC, PHURINE, GLUCOSEU, HGBUR, BILIRUBINUR, KETONESUR, PROTEINUR, UROBILINOGEN, NITRITE, Hurshel Party M.D. [facility] 03/11/2016, 1:06 PM  Pager: [phone] Between 7am to 7pm - call Pager - [phone]  After 7pm go to [email or web address] - password [email or web address]  Call night coverage person  covering after 7pm            [facility]  Patient Demographics  Erik Shields, is a 66 y.o. male, DOB - 1950-10-21, ZOX:[id number]  Admit date - 03/09/2016   Admitting Physician Clydie Braun, MD  Outpatient Primary MD for the patient is [facility]  Outpatient specialists:   LOS - 0  days    Chief Complaint  Patient presents with  . Abnormal Lab       Brief summary   Patient is a 66 year old male with history of DDD, gout, and chronic back pain presented to ED with abnormal lab work. Patient noted that for the last 1 month he was being easily fatigued with minimal exertion, lower leg swelling, dyspnea with exertion. Patient was seen by PCP and was called with a hemoglobin of 6.9 and was recommended to go to ED. Patient admitted to daily use of Goody powders 1-2 packets every day for the past 5 months with a daily aspirin. Hemoglobin was 6.8.   Assessment & Plan    Principal Problem:   Symptomatic anemia:Hemoglobin 6.8 at the time of admission - FOBT negative, - Status post 1 unit packed RBCs, hemoglobin 7.7. Colonoscopy last year per patient showed diverticulosis - Also reports more than 20 pounds weight loss, chronic abdominal pain for past 2-3 months, daily goody powder use, "pain pills" - Anemia panel showed iron 52, ferritin 3, LDH heptoglobin pending - GI consultation obtained, discussed with Dr. Elnoria Howard, will follow recommendations, currently on clear liquid diet - Continue PPI    Active Problems:   Swelling of lower extremity - Improved with elevation of the lower extremities, also third spacing and hypo-albuminemia with albumin of 2.7   Code Status: Full code  DVT Prophylaxis:  SCD's Family Communication: Discussed in detail with the patient, all imaging results, lab results explained to the patient  and wife  Disposition Plan: Pending GI  evaluation  Time Spent in minutes  25 minutes  Procedures:    Consultants:   GI, Dr Elnoria Howard   Antimicrobials:      Medications  Scheduled Meds: . pantoprazole  40 mg Oral BID   Continuous Infusions: PRN Meds:.acetaminophen **OR** acetaminophen, albuterol, ondansetron **OR** ondansetron (ZOFRAN) IV, traMADol   Antibiotics   Anti-infectives    None        Subjective:   Erik Shields was seen and examined today. Currently denies any GI bleeding, hematochezia or melena.  Patient denies dizziness, chest pain, shortness of breath, abdominal pain, N/V/D/C, new weakness, numbess, tingling. No acute events overnight.    Objective:   Vitals:   03/10/16 1318 03/10/16 1629 03/10/16 2226 03/11/16 0535  BP: 131/67 126/84 122/71 131/80  Pulse: 68 64 75 76  Resp: 16 16 17 17   Temp: 98.4 F (36.9 C) 98.5 F (36.9 C) 99.1 F (37.3 C) 98.9 F (37.2 C)  TempSrc: Oral Oral Oral Oral  SpO2: 100% 100% 99% 99%  Weight:      Height:        Intake/Output Summary (Last 24 hours) at 03/11/16 1307 Last data filed at 03/11/16 0855  Gross per 24 hour  Intake             1235 ml  Output                0 ml  Net             1235 ml     Wt Readings from Last 3 Encounters:  03/09/16 72.6 kg (160 lb)     Exam  General: Alert and  oriented x 3, NAD  HEENT:  PERRLA, EOMI, Anicteric Sclera, mucous membranes moist.   Neck: Supple, no JVD, no masses  Cardiovascular: S1 S2 auscultated, no rubs, murmurs or gallops. Regular rate and rhythm.  Respiratory: Clear to auscultation bilaterally, no wheezing, rales or rhonchi  Gastrointestinal: Soft, nontender, nondistended, + bowel sounds  Ext: no cyanosis clubbing or edema  Neuro: AAOx3, Cr N's II- XII. Strength 5/5 upper and lower extremities bilaterally  Skin: No rashes  Psych: Normal affect and demeanor, alert and oriented x3    Data Reviewed:  I have personally reviewed following labs and imaging studies  Micro  Results No results found for this or any previous visit (from the past 240 hour(s)).  Radiology Reports No results found.  Lab Data:  CBC:  Recent Labs Lab 03/09/16 1651 03/10/16 0534 03/10/16 1230 03/10/16 1808 03/11/16 0436  WBC 10.4 6.8  --   --   --   NEUTROABS 5.7  --   --   --   --   HGB 6.8* 7.7* 8.2* 9.1* 9.5*  HCT 22.8* 25.4* 26.1* 29.8* 30.2*  MCV 78.4 79.6  --   --   --   PLT 415* 316  --   --   --    Basic Metabolic Panel:  Recent Labs Lab 03/09/16 1651 03/10/16 0534  NA 138 142  K 3.9 4.0  CL 109 112*  CO2 25 25  GLUCOSE 109* 96  BUN 18 10  CREATININE 0.81 0.81  CALCIUM 8.0* 8.1*   GFR: Estimated Creatinine Clearance: 89.7 mL/min (by C-G formula based on SCr of 0.81 mg/dL). Liver Function Tests:  Recent Labs Lab 03/09/16 1651  AST 26  ALT 20  ALKPHOS 55  BILITOT 0.5  PROT 4.6*  ALBUMIN 2.7*   No results for input(s): LIPASE, AMYLASE in the last 168 hours. No results for input(s): AMMONIA in the last 168 hours. Coagulation Profile: No results for input(s): INR, PROTIME in the last 168 hours. Cardiac Enzymes: No results for input(s): CKTOTAL, CKMB, CKMBINDEX, TROPONINI in the last 168 hours. BNP (last 3 results) No results for input(s): PROBNP in the last 8760 hours. HbA1C: No results for input(s): HGBA1C in the last 72 hours. CBG: No results for input(s): GLUCAP in the last 168 hours. Lipid Profile: No results for input(s): CHOL, HDL, LDLCALC, TRIG, CHOLHDL, LDLDIRECT in the last 72 hours. Thyroid Function Tests: No results for input(s): TSH, T4TOTAL, FREET4, T3FREE, THYROIDAB in the last 72 hours. Anemia Panel:  Recent Labs  03/10/16 0648  VITAMINB12 315  FOLATE 21.0  FERRITIN 3*  TIBC 378  IRON 52  RETICCTPCT 1.3   Urine analysis: No results found for: COLORURINE, APPEARANCEUR, LABSPEC, PHURINE, GLUCOSEU, HGBUR, BILIRUBINUR, KETONESUR, PROTEINUR, UROBILINOGEN, NITRITE, Hurshel PartyLEUKOCYTESUR   Zemirah Krasinski M.D. [facility] 03/11/2016, 1:07 PM  Pager: [phone] Between 7am to 7pm - call Pager - [phone]  After 7pm go to [email or web address] - password [email or web address]  Call night coverage person covering after 7pm

## 2016-03-11 NOTE — Progress Notes (Signed)
Pt is to be npo from midnight for an EGD tomorrow. Consent signed and placed in chart

## 2016-03-12 ENCOUNTER — Observation Stay (HOSPITAL_COMMUNITY): Payer: BLUE CROSS/BLUE SHIELD | Admitting: Anesthesiology

## 2016-03-12 ENCOUNTER — Encounter (HOSPITAL_COMMUNITY)
Admission: EM | Disposition: A | Discharge status: Home / Self Care | Payer: Self-pay | Source: Home / Self Care | Attending: Emergency Medicine

## 2016-03-12 ENCOUNTER — Encounter (HOSPITAL_COMMUNITY): Payer: Self-pay | Admitting: *Deleted

## 2016-03-12 DIAGNOSIS — D649 Anemia, unspecified: Secondary | ICD-10-CM | POA: Diagnosis not present

## 2016-03-12 DIAGNOSIS — M7989 Other specified soft tissue disorders: Secondary | ICD-10-CM | POA: Diagnosis not present

## 2016-03-12 HISTORY — PX: ESOPHAGOGASTRODUODENOSCOPY: SHX5428

## 2016-03-12 LAB — BASIC METABOLIC PANEL
Anion gap: 9 (ref 5–15)
BUN: 10 mg/dL (ref 6–20)
CALCIUM: 8.8 mg/dL — AB (ref 8.9–10.3)
CO2: 24 mmol/L (ref 22–32)
CREATININE: 0.87 mg/dL (ref 0.61–1.24)
Chloride: 109 mmol/L (ref 101–111)
GFR calc Af Amer: >60 mL/min (ref >60)
GFR calc non Af Amer: >60 mL/min (ref >60)
GLUCOSE: 90 mg/dL (ref 65–99)
POTASSIUM: 4.2 mmol/L (ref 3.5–5.1)
SODIUM: 142 mmol/L (ref 135–145)

## 2016-03-12 LAB — CBC
HCT: 32.9 % — ABNORMAL LOW (ref 39.0–52.0)
Hemoglobin: 9.9 g/dL — ABNORMAL LOW (ref 13.0–17.0)
MCH: 24.4 pg — AB (ref 26.0–34.0)
MCHC: 30.1 g/dL (ref 30.0–36.0)
MCV: 81 fL (ref 78.0–100.0)
PLATELETS: 333 10*3/uL (ref 150–400)
RBC: 4.06 MIL/uL — ABNORMAL LOW (ref 4.22–5.81)
RDW: 15.3 % (ref 11.5–15.5)
WBC: 7.7 10*3/uL (ref 4.0–10.5)

## 2016-03-12 SURGERY — EGD (ESOPHAGOGASTRODUODENOSCOPY)
Anesthesia: Monitor Anesthesia Care

## 2016-03-12 MED ORDER — SODIUM CHLORIDE 0.9 % IV SOLN: INTRAVENOUS | Status: DC

## 2016-03-12 MED ORDER — LACTATED RINGERS IV SOLN
INTRAVENOUS | Status: DC | PRN
  Administered 2016-03-12: 07:00:00 via INTRAVENOUS

## 2016-03-12 MED ORDER — PROPOFOL 10 MG/ML IV BOLUS
INTRAVENOUS | Status: DC | PRN
  Administered 2016-03-12: 20 mg via INTRAVENOUS
  Administered 2016-03-12: 40 mg via INTRAVENOUS
  Administered 2016-03-12: 30 mg via INTRAVENOUS
  Administered 2016-03-12: 20 mg via INTRAVENOUS
  Administered 2016-03-12: 50 mg via INTRAVENOUS
  Administered 2016-03-12: 40 mg via INTRAVENOUS
  Administered 2016-03-12: 20 mg via INTRAVENOUS

## 2016-03-12 MED ORDER — PANTOPRAZOLE SODIUM 40 MG PO TBEC: 40.0000 mg | DELAYED_RELEASE_TABLET | Freq: Every day | ORAL | 3 refills | Status: DC

## 2016-03-12 MED ORDER — FERROUS GLUCONATE 324 (38 FE) MG PO TABS: 324.0000 mg | ORAL_TABLET | Freq: Every day | ORAL | 3 refills | Status: DC

## 2016-03-12 MED ORDER — LACTATED RINGERS IV SOLN
INTRAVENOUS | Status: DC
  Administered 2016-03-12: 07:00:00 via INTRAVENOUS

## 2016-03-12 MED ORDER — LIDOCAINE HCL 1 % IJ SOLN
INTRAMUSCULAR | Status: DC | PRN
  Administered 2016-03-12: 50 mg via INTRADERMAL

## 2016-03-12 NOTE — Progress Notes (Signed)
Discharge instructions (including medications) discussed with and copy provided to patient/caregiver 

## 2016-03-12 NOTE — Interval H&P Note (Signed)
History and Physical Interval Note:  03/12/2016 7:28 AM  Erik Shields  has presented today for surgery, with the diagnosis of Anemia, ABM pain, Weight loss  The various methods of treatment have been discussed with the patient and family. After consideration of risks, benefits and other options for treatment, the patient has consented to  Procedure(s): ESOPHAGOGASTRODUODENOSCOPY (EGD) (N/A) as a surgical intervention .  The patient's history has been reviewed, patient examined, no change in status, stable for surgery.  I have reviewed the patient's chart and labs.  Questions were answered to the patient's satisfaction.     Ken Bonn D

## 2016-03-12 NOTE — H&P (View-Only) (Signed)
Reason for Consult: Symptomatic anemia Referring Physician: [facility]  Lenn Cal HPI: The patient was evaluated by his PCP for complaints of fatigue with minor exertion.  Blood work revealed that his HGB was at 6.9 g/dL and he was instructed to go to the ER for further evaluation and transfusion.  He states that he was using Goody's Powder for the past 5 months for back pain, but he denied any issues with hematochezia, melena, or hematemesis.  A colonoscopy was performed at the office on 07/03/2015 with findings of left sided diverticula.  His wife states that he has lost 20 lbs since last summer.  She reports that they reported this issue to his PCP, but no further work up was pursued.  Currently the patient minimizes his symptoms of weight loss and abdominal pain.  He does report some upper abdominal pain associated with PO intake.    History reviewed. No pertinent past medical history.  Past Surgical History:  Procedure Laterality Date  . BACK SURGERY      No family history on file.  Social History:  reports that he has never smoked. His smokeless tobacco use includes Chew. He reports that he does not drink alcohol or use drugs.  Allergies: No Known Allergies  Medications:  Scheduled: . sodium chloride   Intravenous Once  . pantoprazole  40 mg Oral BID   Continuous:   Results for orders placed or performed during the hospital encounter of 03/09/16 (from the past 24 hour(s))  CBC with Differential     Status: Abnormal   Collection Time: 03/09/16  4:51 PM  Result Value Ref Range   WBC 10.4 4.0 - 10.5 K/uL   RBC 2.91 (L) 4.22 - 5.81 MIL/uL   Hemoglobin 6.8 (LL) 13.0 - 17.0 g/dL   HCT 40.9 (L) 81.1 - 91.4 %   MCV 78.4 78.0 - 100.0 fL   MCH 23.4 (L) 26.0 - 34.0 pg   MCHC 29.8 (L) 30.0 - 36.0 g/dL   RDW 78.2 95.6 - 21.3 %   Platelets 415 (H) 150 - 400 K/uL   Neutrophils Relative % 54 %   Neutro Abs 5.7 1.7 - 7.7 K/uL   Lymphocytes Relative 27 %   Lymphs Abs 2.9  0.7 - 4.0 K/uL   Monocytes Relative 8 %   Monocytes Absolute 0.9 0.1 - 1.0 K/uL   Eosinophils Relative 10 %   Eosinophils Absolute 1.0 (H) 0.0 - 0.7 K/uL   Basophils Relative 1 %   Basophils Absolute 0.1 0.0 - 0.1 K/uL  Comprehensive metabolic panel     Status: Abnormal   Collection Time: 03/09/16  4:51 PM  Result Value Ref Range   Sodium 138 135 - 145 mmol/L   Potassium 3.9 3.5 - 5.1 mmol/L   Chloride 109 101 - 111 mmol/L   CO2 25 22 - 32 mmol/L   Glucose, Bld 109 (H) 65 - 99 mg/dL   BUN 18 6 - 20 mg/dL   Creatinine, Ser 0.86 0.61 - 1.24 mg/dL   Calcium 8.0 (L) 8.9 - 10.3 mg/dL   Total Protein 4.6 (L) 6.5 - 8.1 g/dL   Albumin 2.7 (L) 3.5 - 5.0 g/dL   AST 26 15 - 41 U/L   ALT 20 17 - 63 U/L   Alkaline Phosphatase 55 38 - 126 U/L   Total Bilirubin 0.5 0.3 - 1.2 mg/dL   GFR calc non Af Amer >60 >60 mL/min   GFR calc Af Amer >60 >60 mL/min  Anion gap 4 (L) 5 - 15  Type and screen [facility]     Status: None (Preliminary result)   Collection Time: 03/09/16  5:54 PM  Result Value Ref Range   ABO/RH(D) O NEG    Antibody Screen NEG    Sample Expiration 03/12/2016    Unit Number [id number]    Blood Component Type RED CELLS,LR    Unit division 00    Status of Unit ISSUED,FINAL    Transfusion Status OK TO TRANSFUSE    Crossmatch Result Compatible    Unit Number [id number]    Blood Component Type RED CELLS,LR    Unit division 00    Status of Unit ALLOCATED    Transfusion Status OK TO TRANSFUSE    Crossmatch Result Compatible   Prepare RBC     Status: None   Collection Time: 03/09/16  5:54 PM  Result Value Ref Range   Order Confirmation ORDER PROCESSED BY BLOOD BANK   ABO/Rh     Status: None   Collection Time: 03/09/16  5:54 PM  Result Value Ref Range   ABO/RH(D) O NEG   POC occult blood, ED     Status: None   Collection Time: 03/09/16  6:04 PM  Result Value Ref Range   Fecal Occult Bld NEGATIVE NEGATIVE  Brain natriuretic peptide     Status: None    Collection Time: 03/09/16  7:15 PM  Result Value Ref Range   B Natriuretic Peptide 42.3 0.0 - 100.0 pg/mL  I-Stat Troponin, ED ([facility])     Status: None   Collection Time: 03/09/16  7:40 PM  Result Value Ref Range   Troponin i, poc 0.00 0.00 - 0.08 ng/mL   Comment 3          CBC     Status: Abnormal   Collection Time: 03/10/16  5:34 AM  Result Value Ref Range   WBC 6.8 4.0 - 10.5 K/uL   RBC 3.19 (L) 4.22 - 5.81 MIL/uL   Hemoglobin 7.7 (L) 13.0 - 17.0 g/dL   HCT 30.825.4 (L) 65.739.0 - 84.652.0 %   MCV 79.6 78.0 - 100.0 fL   MCH 24.1 (L) 26.0 - 34.0 pg   MCHC 30.3 30.0 - 36.0 g/dL   RDW 96.215.2 95.211.5 - 84.115.5 %   Platelets 316 150 - 400 K/uL  Basic metabolic panel     Status: Abnormal   Collection Time: 03/10/16  5:34 AM  Result Value Ref Range   Sodium 142 135 - 145 mmol/L   Potassium 4.0 3.5 - 5.1 mmol/L   Chloride 112 (H) 101 - 111 mmol/L   CO2 25 22 - 32 mmol/L   Glucose, Bld 96 65 - 99 mg/dL   BUN 10 6 - 20 mg/dL   Creatinine, Ser 3.240.81 0.61 - 1.24 mg/dL   Calcium 8.1 (L) 8.9 - 10.3 mg/dL   GFR calc non Af Amer >60 >60 mL/min   GFR calc Af Amer >60 >60 mL/min   Anion gap 5 5 - 15  Ferritin     Status: Abnormal   Collection Time: 03/10/16  6:48 AM  Result Value Ref Range   Ferritin 3 (L) 24 - 336 ng/mL  Folate     Status: None   Collection Time: 03/10/16  6:48 AM  Result Value Ref Range   Folate 21.0 >5.9 ng/mL  Iron and TIBC     Status: Abnormal   Collection Time: 03/10/16  6:48 AM  Result  Value Ref Range   Iron 52 45 - 182 ug/dL   TIBC 161 096 - 045 ug/dL   Saturation Ratios 14 (L) 17.9 - 39.5 %   UIBC 326 ug/dL  Reticulocytes     Status: Abnormal   Collection Time: 03/10/16  6:48 AM  Result Value Ref Range   Retic Ct Pct 1.3 0.4 - 3.1 %   RBC. 3.28 (L) 4.22 - 5.81 MIL/uL   Retic Count, Manual 42.6 19.0 - 186.0 K/uL  Vitamin B12     Status: None   Collection Time: 03/10/16  6:48 AM  Result Value Ref Range   Vitamin B-12 315 180 - 914 pg/mL  Prepare RBC     Status:  None   Collection Time: 03/10/16  9:24 AM  Result Value Ref Range   Order Confirmation ORDER PROCESSED BY BLOOD BANK      No results found.  ROS:  As stated above in the HPI otherwise negative.  Blood pressure 129/79, pulse 72, temperature 98.2 F (36.8 C), temperature source Oral, resp. rate 19, height 5\' 9"  (1.753 m), weight 72.6 kg (160 lb), SpO2 100 %.    PE: Gen: NAD, Alert and Oriented HEENT:  [place]/AT, EOMI Neck: Supple, no LAD Lungs: CTA Bilaterally CV: RRR without M/G/R ABM: Soft, NTND, +BS Ext: No C/C/E  Assessment/Plan: 1) Anemia. 2) NSAID use. 3) Back pain. 4) Weight loss.   With his use of NSAIDs on a consistent basis, his anemia, and weight loss, I will pursue an EGD for further evaluation.    Plan: 1) EGD - Friday at 7:30 AM.  (Scheduling conflicts). 2) Follow HGB. 3) Continue with PPI.  Murrell Elizondo D 03/10/2016, 11:22 AM

## 2016-03-12 NOTE — Anesthesia Procedure Notes (Signed)
Procedure Name: MAC Date/Time: 03/12/2016 7:33 AM Performed by: Neldon Newport Pre-anesthesia Checklist: Timeout performed, Patient being monitored, Suction available, Emergency Drugs available and Patient identified Patient Re-evaluated:Patient Re-evaluated prior to inductionOxygen Delivery Method: Nasal cannula Placement Confirmation: positive ETCO2

## 2016-03-12 NOTE — Discharge Summary (Signed)
Physician Discharge Summary   Patient ID: Erik Shields MRN: [id number] DOB/AGE: 1950-07-12 66 y.o.  Admit date: 03/09/2016 Discharge date: 03/12/2016  Primary Care Physician:  [facility]  Discharge Diagnoses:    . Symptomatic anemia . Swelling of lower extremity Iron deficiency anemia   Consults:  Gastroenterology, Dr Elnoria Howard   Recommendations for Outpatient Follow-up:  1. Please repeat CBC/BMET at next visit 2. The patient was recommended to stop NSAIDs, Goody powders   DIET: Heart healthy diet    Allergies:  No Known Allergies   DISCHARGE MEDICATIONS: Discharge Medication List as of 03/12/2016 10:21 AM    START taking these medications   Details  ferrous gluconate (FERGON) 324 MG tablet Take 1 tablet (324 mg total) by mouth daily with breakfast., Starting Fri 03/12/2016, Print    pantoprazole (PROTONIX) 40 MG tablet Take 1 tablet (40 mg total) by mouth daily., Starting Fri 03/12/2016, Print      CONTINUE these medications which have NOT CHANGED   Details  oxyCODONE-acetaminophen (PERCOCET) 10-325 MG tablet Take 0.5 tablets by mouth 4 (four) times daily as needed for pain. , Starting Sun 02/08/2016, Historical Med    polyethylene glycol powder (GLYCOLAX/MIRALAX) powder Take 17 g by mouth at bedtime., Historical Med    traMADol (ULTRAM) 50 MG tablet Take 1 tablet by mouth 2 (two) times daily as needed (for elbow pain). , Starting Tue 02/17/2016, Historical Med      STOP taking these medications     aspirin EC 81 MG tablet      Aspirin-Acetaminophen-Caffeine (GOODY HEADACHE PO)      diclofenac (VOLTAREN) 75 MG EC tablet          Brief H and P: For complete details please refer to admission H and P, but in briefPatient is a 66 year old male with history of DDD, gout, and chronic back pain presented to ED with abnormal lab work. Patient noted that for the last 1 month he was being easily fatigued with minimal exertion, lower  leg swelling, dyspnea with exertion. Patient was seen by PCP and was called with a hemoglobin of 6.9 and was recommended to go to ED. Patient admitted to daily use of Goody powders 1-2 packets every day for the past 5 months with a daily aspirin. Hemoglobin was 6.8.  Hospital Course:   Symptomatic anemia:Hemoglobin 6.8 at the time of admission - FOBT negative, - Status post 2 unit packed RBCs, hemoglobin 9.5. Colonoscopy last year per patient showed diverticulosis - Also reported more than 20 pounds weight loss, chronic abdominal pain for past 2-3 months, daily goody powder use, "pain pills" - Anemia panel showed iron 52, ferritin 3, LDH 162, haptoglobin 135 - GI consultation was obtained, patient was placed on PPI - Endoscopy showed normal esophagus, nodular mucosa in the gastric body and gastric antrum, biopsied, normal duodenum. - The patient was recommended outpatient follow-up in 2-4 weeks and capsule endoscopy outpatient - Patient was recommended outpatient CBC checkup with PCP, may need outpatient hematology referral if capsule endoscopies also negative.    Swelling of lower extremity - Improved with elevation of the lower extremities, also third spacing and hypo-albuminemia with albumin of 2.7  Iron deficiency anemia - place on iron supplementation   Day of Discharge BP 118/75   Pulse 65   Temp 98.1 F (36.7 C) (Oral)   Resp 12   Ht 5\' 9"  (1.753 m)   Wt 72.6 kg (160 lb)   SpO2 98%   BMI 23.63  kg/m   Physical Exam: General: Alert and awake oriented x3 not in any acute distress. HEENT: anicteric sclera, pupils reactive to light and accommodation CVS: S1-S2 clear no murmur rubs or gallops Chest: clear to auscultation bilaterally, no wheezing rales or rhonchi Abdomen: soft nontender, nondistended, normal bowel sounds Extremities: no cyanosis, clubbing or edema noted bilaterally Neuro: Cranial nerves II-XII intact, no focal neurological deficits   The results of  significant diagnostics from this hospitalization (including imaging, microbiology, ancillary and laboratory) are listed below for reference.    LAB RESULTS: Basic Metabolic Panel:  Recent Labs Lab 03/10/16 0534 03/12/16 0601  NA 142 142  K 4.0 4.2  CL 112* 109  CO2 25 24  GLUCOSE 96 90  BUN 10 10  CREATININE 0.81 0.87  CALCIUM 8.1* 8.8*   Liver Function Tests:  Recent Labs Lab 03/09/16 1651  AST 26  ALT 20  ALKPHOS 55  BILITOT 0.5  PROT 4.6*  ALBUMIN 2.7*   No results for input(s): LIPASE, AMYLASE in the last 168 hours. No results for input(s): AMMONIA in the last 168 hours. CBC:  Recent Labs Lab 03/09/16 1651 03/10/16 0534  03/11/16 0436 03/12/16 0601  WBC 10.4 6.8  --   --  7.7  NEUTROABS 5.7  --   --   --   --   HGB 6.8* 7.7*  < > 9.5* 9.9*  HCT 22.8* 25.4*  < > 30.2* 32.9*  MCV 78.4 79.6  --   --  81.0  PLT 415* 316  --   --  333  < > = values in this interval not displayed. Cardiac Enzymes: No results for input(s): CKTOTAL, CKMB, CKMBINDEX, TROPONINI in the last 168 hours. BNP: Invalid input(s): POCBNP CBG: No results for input(s): GLUCAP in the last 168 hours.  Significant Diagnostic Studies:  No results found.  2D ECHO:   Disposition and Follow-up: Discharge Instructions    Diet - low sodium heart healthy    Complete by:  As directed    Increase activity slowly    Complete by:  As directed        DISPOSITION: Home   DISCHARGE FOLLOW-UP Follow-up Information    [facility] [place]. Schedule an appointment as soon as possible for a visit in 2 week(s).   Specialty:  Family Medicine Why:  please check CBC at 2 weeksfollow-up for the blood counts   Contact information: [address] [place] [id number] [phone]        HUNG,PATRICK D, MD. Schedule an appointment as soon as possible for a visit in 2 week(s).   Specialty:  Gastroenterology Contact information: [address],  [address] [place] [address] [phone]            Time spent on Discharge: 23mins   Signed:   Chasidy Janak M.D. [facility] 03/12/2016, 11:31 AM Pager: [phone]

## 2016-03-12 NOTE — Op Note (Addendum)
[facility] Patient Name: Erik Shields Procedure Date : 03/12/2016 MRN: [id number] Attending MD: Jeani Hawking , MD Date of Birth: 17-Apr-1950 CSN: [id number] Age: 66 Admit Type: Inpatient Procedure:                Upper GI endoscopy Indications:              Iron deficiency anemia Providers:                Jeani Hawking, MD, Roselie Awkward, RN, Beryle Beams,                            Technician, Carmela Rima CRNA, CRNA Referring MD:              Medicines:                Propofol per Anesthesia Complications:            No immediate complications. Estimated Blood Loss:     Estimated blood loss was minimal. Procedure:                Pre-Anesthesia Assessment:                           - Prior to the procedure, a History and Physical                            was performed, and patient medications and                            allergies were reviewed. The patient's tolerance of                            previous anesthesia was also reviewed. The risks                            and benefits of the procedure and the sedation                            options and risks were discussed with the patient.                            All questions were answered, and informed consent                            was obtained. Prior Anticoagulants: The patient has                            taken no previous anticoagulant or antiplatelet                            agents. ASA Grade Assessment: II - A patient with                            mild systemic disease. After reviewing the risks  and benefits, the patient was deemed in                            satisfactory condition to undergo the procedure.                           After obtaining informed consent, the endoscope was                            passed under direct vision. Throughout the                            procedure, the patient's blood pressure, pulse, and                             oxygen saturations were monitored continuously. The                            [id number] ([id number]) scope was introduced through the                            mouth, and advanced to the second part of duodenum.                            The upper GI endoscopy was accomplished without                            difficulty. The patient tolerated the procedure                            well. Scope In: Scope Out: Findings:      The esophagus was normal.      Diffuse nodular mucosa was found in the gastric body and in the gastric       antrum. Biopsies were taken with a cold forceps for histology.      The examined duodenum was normal. Impression:               - Normal esophagus.                           - Nodular mucosa in the gastric body and in the                            gastric antrum. Biopsied.                           - Normal examined duodenum. Moderate Sedation:      None Recommendation:           - Return patient to hospital ward for ongoing care.                           - Resume regular diet.                           - Continue present medications.                           -  Await pathology results.                           - Okay to discharge home.                           - Return to GI clinic in 2-4 weeks.                           - Outpatient capsule endoscopy. Procedure Code(s):        --- Professional ---                           [id number], Esophagogastroduodenoscopy, flexible,                            transoral; with biopsy, single or multiple Diagnosis Code(s):        --- Professional ---                           K31.89, Other diseases of stomach and duodenum                           D50.9, Iron deficiency anemia, unspecified CPT copyright 2016 American Medical Association. All rights reserved. The codes documented in this report are preliminary and upon coder review may  be revised to meet current compliance requirements. Jeani Hawking,  MD Jeani Hawking, MD 03/12/2016 7:47:11 AM This report has been signed electronically. Number of Addenda: 0

## 2016-03-12 NOTE — Anesthesia Postprocedure Evaluation (Signed)
Anesthesia Post Note  Patient: Erik Shields  Procedure(s) Performed: Procedure(s) (LRB): ESOPHAGOGASTRODUODENOSCOPY (EGD) (N/A)  Patient location during evaluation: PACU Anesthesia Type: MAC Level of consciousness: awake and alert Pain management: pain level controlled Vital Signs Assessment: post-procedure vital signs reviewed and stable Respiratory status: spontaneous breathing, nonlabored ventilation, respiratory function stable and patient connected to nasal cannula oxygen Cardiovascular status: stable and blood pressure returned to baseline Anesthetic complications: no       Last Vitals:  Vitals:   03/12/16 0750 03/12/16 0800  BP: 129/74 118/75  Pulse: 78 65  Resp: 14 12  Temp: 36.7 C     Last Pain:  Vitals:   03/12/16 0857  TempSrc:   PainSc: 0-No pain                 [name]

## 2016-03-12 NOTE — Transfer of Care (Signed)
Immediate Anesthesia Transfer of Care Note  Patient: Erik Shields  Procedure(s) Performed: Procedure(s): ESOPHAGOGASTRODUODENOSCOPY (EGD) (N/A)  Patient Location: Endoscopy Unit  Anesthesia Type:MAC  Level of Consciousness: awake, alert  and oriented  Airway & Oxygen Therapy: Patient Spontanous Breathing and Patient connected to nasal cannula oxygen  Post-op Assessment: Report given to RN, Post -op Vital signs reviewed and stable and Patient moving all extremities X 4  Post vital signs: Reviewed and stable  Last Vitals:  Vitals:   03/12/16 0558 03/12/16 0710  BP: 118/67 (!) 141/79  Pulse: 74 86  Resp: 17 16  Temp: 36.9 C 36.7 C    Last Pain:  Vitals:   03/12/16 0710  TempSrc: Oral  PainSc:          Complications: No apparent anesthesia complications

## 2016-03-12 NOTE — Anesthesia Preprocedure Evaluation (Addendum)
Anesthesia Evaluation  Patient identified by MRN, date of birth, ID band Patient awake    Reviewed: Allergy & Precautions, H&P , NPO status , Patient's Chart, lab work & pertinent test results  Airway Mallampati: II   Neck ROM: full    Dental  (+) Edentulous Upper, Edentulous Lower, Dental Advidsory Given   Pulmonary neg pulmonary ROS,    breath sounds clear to auscultation       Cardiovascular negative cardio ROS   Rhythm:regular Rate:Normal     Neuro/Psych    GI/Hepatic   Endo/Other    Renal/GU      Musculoskeletal   Abdominal   Peds  Hematology  (+) Blood dyscrasia, anemia ,   Anesthesia Other Findings   Reproductive/Obstetrics                           Anesthesia Physical Anesthesia Plan  ASA: II  Anesthesia Plan: MAC   Post-op Pain Management:    Induction: Intravenous  Airway Management Planned: Nasal Cannula  Additional Equipment:   Intra-op Plan:   Post-operative Plan:   Informed Consent: I have reviewed the patients History and Physical, chart, labs and discussed the procedure including the risks, benefits and alternatives for the proposed anesthesia with the patient or authorized representative who has indicated his/her understanding and acceptance.   Dental Advisory Given  Plan Discussed with: CRNA, Anesthesiologist and Surgeon  Anesthesia Plan Comments:        Anesthesia Quick Evaluation

## 2016-03-14 ENCOUNTER — Encounter (HOSPITAL_COMMUNITY): Payer: Self-pay | Admitting: Gastroenterology

## 2019-10-18 ENCOUNTER — Encounter (HOSPITAL_COMMUNITY): Payer: Self-pay

## 2019-10-18 ENCOUNTER — Other Ambulatory Visit: Payer: Self-pay

## 2019-10-18 ENCOUNTER — Emergency Department (HOSPITAL_COMMUNITY): Payer: Managed Care, Other (non HMO)

## 2019-10-18 ENCOUNTER — Emergency Department (HOSPITAL_COMMUNITY)
Admission: EM | Admit: 2019-10-18 | Discharge: 2019-10-18 | Disposition: A | Discharge status: Home / Self Care | Payer: Managed Care, Other (non HMO) | Attending: Emergency Medicine | Admitting: Emergency Medicine

## 2019-10-18 DIAGNOSIS — U071 COVID-19: Secondary | ICD-10-CM | POA: Diagnosis not present

## 2019-10-18 DIAGNOSIS — R531 Weakness: Secondary | ICD-10-CM

## 2019-10-18 DIAGNOSIS — R110 Nausea: Secondary | ICD-10-CM

## 2019-10-18 DIAGNOSIS — J1282 Pneumonia due to coronavirus disease 2019: Secondary | ICD-10-CM | POA: Insufficient documentation

## 2019-10-18 LAB — BASIC METABOLIC PANEL
Anion gap: 11 (ref 5–15)
BUN: 18 mg/dL (ref 8–23)
CO2: 26 mmol/L (ref 22–32)
Calcium: 9.3 mg/dL (ref 8.9–10.3)
Chloride: 102 mmol/L (ref 98–111)
Creatinine, Ser: 0.79 mg/dL (ref 0.61–1.24)
GFR calc Af Amer: >60 mL/min (ref >60)
GFR calc non Af Amer: >60 mL/min (ref >60)
Glucose, Bld: 100 mg/dL — ABNORMAL HIGH (ref 70–99)
Potassium: 3.6 mmol/L (ref 3.5–5.1)
Sodium: 139 mmol/L (ref 135–145)

## 2019-10-18 LAB — URINALYSIS, ROUTINE W REFLEX MICROSCOPIC
Bilirubin Urine: NEGATIVE
Glucose, UA: NEGATIVE mg/dL
Hgb urine dipstick: NEGATIVE
Ketones, ur: 5 mg/dL — AB
Leukocytes,Ua: NEGATIVE
Nitrite: NEGATIVE
Protein, ur: NEGATIVE mg/dL
Specific Gravity, Urine: 1.019 (ref 1.005–1.030)
pH: 5 (ref 5.0–8.0)

## 2019-10-18 LAB — CBC
HCT: 41.5 % (ref 39.0–52.0)
Hemoglobin: 14.2 g/dL (ref 13.0–17.0)
MCH: 33 pg (ref 26.0–34.0)
MCHC: 34.2 g/dL (ref 30.0–36.0)
MCV: 96.5 fL (ref 80.0–100.0)
Platelets: 438 10*3/uL — ABNORMAL HIGH (ref 150–400)
RBC: 4.3 MIL/uL (ref 4.22–5.81)
RDW: 12.3 % (ref 11.5–15.5)
WBC: 12.2 10*3/uL — ABNORMAL HIGH (ref 4.0–10.5)
nRBC: 0 % (ref 0.0–0.2)

## 2019-10-18 LAB — RESPIRATORY PANEL BY RT PCR (FLU A&B, COVID)
Influenza A by PCR: NEGATIVE
Influenza B by PCR: NEGATIVE
SARS Coronavirus 2 by RT PCR: POSITIVE — AB

## 2019-10-18 LAB — LACTIC ACID, PLASMA
Lactic Acid, Venous: 1.1 mmol/L (ref 0.5–1.9)
Lactic Acid, Venous: 0.8 mmol/L (ref 0.5–1.9)

## 2019-10-18 MED ORDER — SODIUM CHLORIDE 0.9 % IV BOLUS
1000.0000 mL | Freq: Once | INTRAVENOUS | Status: AC
  Administered 2019-10-18: 19:00:00 1000 mL via INTRAVENOUS

## 2019-10-18 MED ORDER — ONDANSETRON 4 MG PO TBDP: 4.0000 mg | ORAL_TABLET | Freq: Three times a day (TID) | ORAL | 0 refills | Status: DC | PRN

## 2019-10-18 MED ORDER — DEXAMETHASONE 6 MG PO TABS: 6.0000 mg | ORAL_TABLET | Freq: Every day | ORAL | 0 refills | Status: AC

## 2019-10-18 MED ORDER — DOXYCYCLINE HYCLATE 100 MG PO CAPS: 100.0000 mg | ORAL_CAPSULE | Freq: Two times a day (BID) | ORAL | 0 refills | Status: DC

## 2019-10-18 MED ORDER — ONDANSETRON HCL 4 MG/2ML IJ SOLN
4.0000 mg | Freq: Once | INTRAMUSCULAR | Status: AC
  Administered 2019-10-18: 19:00:00 4 mg via INTRAVENOUS
  Filled 2019-10-18: qty 2

## 2019-10-18 NOTE — Discharge Instructions (Signed)
Take the Decadron for cough and SOB  Take the Doxycycline for your Pneumonia  Take the Zofran for nausea  Follow-up with the post Covid care clinic.  Their contact information is listed on your discharge paperwork.  You will need to call them to schedule an appointment  Return for any new or worsening symptoms.

## 2019-10-18 NOTE — ED Triage Notes (Addendum)
Pt arrived via walk in, COVID (+) 9/4 via home test, worsening sx since. Endorses generalized weakness, headache, and nausea,  Decreased PO intake.

## 2019-10-18 NOTE — ED Notes (Signed)
Patient discharged and left with wife he is ambulatory.

## 2019-10-18 NOTE — ED Provider Notes (Signed)
[facility]-EMERGENCY DEPT Provider Note   CSN: [id number] Arrival date & time: 10/18/19  1522     History Chief Complaint  Patient presents with   Weakness    Erik Shields is a 69 y.o. male with past medical history significant for chronic back pain (home oxycodone) who presents for evaluation of generalized weakness. Positive home COVID test on 09/29/19 while on vacation at [place].  Patient states he continues to have generalized weakness since his home test.  Patient states he feels like his weakness is still and that he is unable to perform activities that he used to perform.  He has no chest pain, shortness of breath.  Does have cough which is nonproductive.  Triage note notes headache however patient denies any headache.  He denies any vision changes, neck pain, neck stiffness, facial droop, difficulty with word finding, unilateral weakness, paresthesias.  He has no chest pain, shortness of breath, hemoptysis.  No associate abdominal pain.  Patient states he has had intermittent diarrhea over the last week. No recent antibiotic use. No melena or BRBPR.  Denies unilateral leg swelling, redness, warmth, dysuria, hematuria.  Denies aggravating or alleviating factors.  States he does have overall decreased p.o. intake due to lack of appetite.  History obtained from patient and past medical records.  No interpreter is used.  HPI     History reviewed. No pertinent past medical history.  Patient Active Problem List   Diagnosis Date Noted   Swelling of lower extremity 03/10/2016   Symptomatic anemia 03/09/2016    Past Surgical History:  Procedure Laterality Date   BACK SURGERY     ESOPHAGOGASTRODUODENOSCOPY N/A 03/12/2016   Procedure: ESOPHAGOGASTRODUODENOSCOPY (EGD);  Surgeon: Jeani Hawking, MD;  Location: [facility] ENDOSCOPY;  Service: Endoscopy;  Laterality: N/A;       History reviewed. No pertinent family history.  Social History   Tobacco Use     Smoking status: Never Smoker   Smokeless tobacco: Current User    Types: Chew  Substance Use Topics   Alcohol use: No   Drug use: No    Home Medications Prior to Admission medications   Medication Sig Start Date End Date Taking? Authorizing Provider  dexamethasone (DECADRON) 6 MG tablet Take 1 tablet (6 mg total) by mouth daily for 5 days. 10/18/19 10/23/19  Rainn Bullinger A, PA-C  doxycycline (VIBRAMYCIN) 100 MG capsule Take 1 capsule (100 mg total) by mouth 2 (two) times daily. 10/18/19   Minnetta Sandora A, PA-C  ferrous gluconate (FERGON) 324 MG tablet Take 1 tablet (324 mg total) by mouth daily with breakfast. 03/12/16   Rai, Ripudeep K, MD  ondansetron (ZOFRAN ODT) 4 MG disintegrating tablet Take 1 tablet (4 mg total) by mouth every 8 (eight) hours as needed for nausea or vomiting. 10/18/19   Amaurie Schreckengost A, PA-C  oxyCODONE-acetaminophen (PERCOCET) 10-325 MG tablet Take 0.5 tablets by mouth 4 (four) times daily as needed for pain.  02/08/16   [provider]  pantoprazole (PROTONIX) 40 MG tablet Take 1 tablet (40 mg total) by mouth daily. 03/12/16   Rai, Ripudeep K, MD  polyethylene glycol powder (GLYCOLAX/MIRALAX) powder Take 17 g by mouth at bedtime.    [provider]  traMADol (ULTRAM) 50 MG tablet Take 1 tablet by mouth 2 (two) times daily as needed (for elbow pain).  02/17/16   [provider]    Allergies    Patient has no known allergies.  Review of Systems  Review of Systems  Constitutional: Positive for activity change, appetite change and fatigue. Negative for chills, diaphoresis, fever and unexpected weight change.  HENT: Negative.   Respiratory: Negative.   Cardiovascular: Negative.   Gastrointestinal: Positive for diarrhea (Intermittent) and nausea. Negative for abdominal distention, abdominal pain, anal bleeding, blood in stool, constipation, rectal pain and vomiting.  Genitourinary: Negative.   Musculoskeletal: Positive for  myalgias. Negative for arthralgias, back pain, gait problem, joint swelling, neck pain and neck stiffness.  Skin: Negative.   Neurological: Positive for weakness (Generalized). Negative for dizziness, tremors, seizures, syncope, facial asymmetry, speech difficulty, light-headedness, numbness and headaches.  All other systems reviewed and are negative.  Physical Exam Updated Vital Signs BP 128/82    Pulse 80    Temp 98.2 F (36.8 C) (Oral)    Resp 16    Ht 5\' 9"  (1.753 m)    Wt 68.9 kg    SpO2 100%    BMI 22.45 kg/m   Physical Exam Physical Exam  Constitutional: Pt is oriented to person, place, and time. Pt appears well-developed and well-nourished. No distress.  HENT:  Head: Normocephalic and atraumatic.  Mouth/Throat: Oropharynx is clear and moist.  Eyes: Conjunctivae and EOM are normal. Pupils are equal, round, and reactive to light. No scleral icterus.  No horizontal, vertical or rotational nystagmus  Neck: Normal range of motion. Neck supple.  Full active and passive ROM without pain No midline or paraspinal tenderness No nuchal rigidity or meningeal signs  Cardiovascular: Normal rate, regular rhythm and intact distal pulses.   Pulmonary/Chest: Effort normal and breath sounds normal. No respiratory distress. Pt has no wheezes. No rales.  Abdominal: Soft. Bowel sounds are normal. There is no tenderness. There is no rebound and no guarding.  Musculoskeletal: Normal range of motion. Compartments soft, No bony tenderness.  Out 100 Lymphadenopathy:    No cervical adenopathy.  Neurological: Pt. is alert and oriented to person, place, and time. He has normal reflexes. No cranial nerve deficit.  Exhibits normal muscle tone. Coordination normal.  Mental Status:  Alert, oriented, thought content appropriate. Speech fluent without evidence of aphasia. Able to follow 2 step commands without difficulty.  Cranial Nerves:  II:  Peripheral visual fields grossly normal, pupils equal, round,  reactive to light III,IV, VI: ptosis not present, extra-ocular motions intact bilaterally  V,VII: smile symmetric, facial light touch sensation equal VIII: hearing grossly normal bilaterally  IX,X: midline uvula rise  XI: bilateral shoulder shrug equal and strong XII: midline tongue extension  Motor:  5/5 in upper and lower extremities bilaterally including strong and equal grip strength and dorsiflexion/plantar flexion Sensory: Pinprick and light touch normal in all extremities.  Deep Tendon Reflexes: 2+ and symmetric  Cerebellar: normal finger-to-nose with bilateral upper extremities Gait: normal gait and balance CV: distal pulses palpable throughout   Skin: Skin is warm and dry. No rash noted. Pt is not diaphoretic.  Psychiatric: Pt has a normal mood and affect. Behavior is normal. Judgment and thought content normal.  Nursing note and vitals reviewed. ED Results / Procedures / Treatments   Labs (all labs ordered are listed, but only abnormal results are displayed) Labs Reviewed  RESPIRATORY PANEL BY RT PCR (FLU A&B, COVID) - Abnormal; Notable for the following components:      Result Value   SARS Coronavirus 2 by RT PCR POSITIVE (*)    All other components within normal limits  BASIC METABOLIC PANEL - Abnormal; Notable for the following components:   Glucose, Bld  100 (*)    All other components within normal limits  CBC - Abnormal; Notable for the following components:   WBC 12.2 (*)    Platelets 438 (*)    All other components within normal limits  URINALYSIS, ROUTINE W REFLEX MICROSCOPIC - Abnormal; Notable for the following components:   Color, Urine AMBER (*)    Ketones, ur 5 (*)    All other components within normal limits  CULTURE, BLOOD (ROUTINE X 2)  CULTURE, BLOOD (ROUTINE X 2)  LACTIC ACID, PLASMA  LACTIC ACID, PLASMA    EKG None  Radiology DG Chest 1 View  Result Date: 10/18/2019 CLINICAL DATA:  COVID-19 positive, shortness of breath, weakness EXAM:  CHEST  1 VIEW COMPARISON:  07/13/2007 FINDINGS: Single frontal view of the chest demonstrates an unremarkable cardiac silhouette. There is diffuse interstitial prominence throughout the lungs, with minimal patchy consolidation at the lung bases. No effusion or pneumothorax. No acute bony abnormalities. IMPRESSION: 1. Diffuse interstitial prominence which may reflect background scarring. 2. Patchy bibasilar consolidation consistent with superimposed bibasilar pneumonia. Electronically Signed   By: Sharlet SalinaMichael  Brown M.D.   On: 10/18/2019 17:56   Procedures Procedures (including critical care time)  Medications Ordered in ED Medications  sodium chloride 0.9 % bolus 1,000 mL (0 mLs Intravenous Stopped 10/18/19 2036)  ondansetron (ZOFRAN) injection 4 mg (4 mg Intravenous Given 10/18/19 1852)   ED Course  I have reviewed the triage vital signs and the nursing notes.  Pertinent labs & imaging results that were available during my care of the patient were reviewed by me and considered in my medical decision making (see chart for details).  69 year old male.  He is afebrile, nonseptic, non-ill-appearing.  Tested positive for Covid 19 days ago.  Has had cough which is productive of light yellow sputum.  He has been tolerating p.o. intake at home however has overall decreased appetite.  Patient has nonfocal neuro exam without deficits.  No facial droop, paresthesias, difficulty with word finding.  His heart and lungs are clear.  His abdomen is soft and nontender.  Did have one episode of diarrhea without melena or blood per rectum yesterday however normal bowel movement today.  No urinary symptoms.  Plan on labs, imaging and reassess  Labs and imaging personally reviewed and interpreted:  CBC with leukocytosis at 12.2 Metabolic panel without electrolyte, renal abnormality Lactic acid 1.1>>0.8 COVID positive UA negative for infection DG chest with superimposed bibasilar pneumonia EKG without STEMI  Patient  reassessed.  Did have blood pressure reading of 91 systolic while here however he does not appear septic.  Labs reassuring.  Cuff was readjusted and patient systolics greater than 115  Patient has been able to tolerate p.o. intake without difficulty.  Ambulatory without ataxic gait.  He continues have nonfocal neuro exam without deficits.  His orthostatic vital signs are negative.  Unfortunately patient with positive test 19 days ago he is not within treatment window for monoclonal antibody treatment.  Given chest x-ray does show possible superimposed infection will treat as infectious given he does have a white count.  Also discussed additional symptomatic management.  He does not meet inpatient criteria at this time as he is without tachycardia, tachypnea or hypoxia.  He has reassuring abdominal exam which is soft without rebound or guarding.  I have referred him outpatient to the post Covid care clinic at [facility].  He will call to schedule appointment.  The patient has been appropriately medically screened and/or stabilized in the ED.  I have low suspicion for any other emergent medical condition which would require further screening, evaluation or treatment in the ED or require inpatient management.  Patient is hemodynamically stable and in no acute distress.  Patient able to ambulate in department prior to ED.  Evaluation does not show acute pathology that would require ongoing or additional emergent interventions while in the emergency department or further inpatient treatment.  I have discussed the diagnosis with the patient and answered all questions.  Pain is been managed while in the emergency department and patient has no further complaints prior to discharge.  Patient is comfortable with plan discussed in room and is stable for discharge at this time.  I have discussed strict return precautions for returning to the emergency department.  Patient was encouraged to follow-up with PCP/specialist refer  to at discharge.  Patient seen and evaluated by attending, Dr. Myrtis Ser who agrees with above treatment, plan and disposition.    MDM Rules/Calculators/A&P                          Erik Shields was evaluated in Emergency Department on 10/18/2019 for the symptoms described in the history of present illness. He was evaluated in the context of the global COVID-19 pandemic, which necessitated consideration that the patient might be at risk for infection with the SARS-CoV-2 virus that causes COVID-19. Institutional protocols and algorithms that pertain to the evaluation of patients at risk for COVID-19 are in a state of rapid change based on information released by regulatory bodies including the CDC and federal and state organizations. These policies and algorithms were followed during the patient's care in the ED. Final Clinical Impression(s) / ED Diagnoses Final diagnoses:  Weakness  COVID-19  Nausea  Pneumonia due to COVID-19 virus    Rx / DC Orders ED Discharge Orders         Ordered    doxycycline (VIBRAMYCIN) 100 MG capsule  2 times daily        10/18/19 2217    ondansetron (ZOFRAN ODT) 4 MG disintegrating tablet  Every 8 hours PRN        10/18/19 2217    dexamethasone (DECADRON) 6 MG tablet  Daily        10/18/19 2217           Kaylaann Mountz A, PA-C 10/18/19 2223    Sabino Donovan, MD 10/18/19 [phone]

## 2019-10-23 LAB — CULTURE, BLOOD (ROUTINE X 2)
Culture: NO GROWTH
Special Requests: ADEQUATE

## 2019-10-31 ENCOUNTER — Ambulatory Visit (INDEPENDENT_AMBULATORY_CARE_PROVIDER_SITE_OTHER): Payer: Managed Care, Other (non HMO) | Admitting: Nurse Practitioner

## 2019-10-31 VITALS — BP 114/72 | HR 80 | Temp 97.5°F | Ht 69.0 in | Wt 153.0 lb

## 2019-10-31 DIAGNOSIS — U071 COVID-19: Secondary | ICD-10-CM | POA: Diagnosis not present

## 2019-10-31 DIAGNOSIS — J1282 Pneumonia due to coronavirus disease 2019: Secondary | ICD-10-CM

## 2019-10-31 NOTE — Patient Instructions (Addendum)
Covid 19 Shortness of breath:   Stay well hydrated  Stay active  Deep breathing exercises  May start vitamin C daily, vitamin D3 daily, Zinc daily  May take tylenol or fever or pain  May take mucinex  twice daily  Will order chest x ray   Follow up:  Follow up as needed

## 2019-10-31 NOTE — Progress Notes (Signed)
@Patient  ID: , male    DOB: [birth year], 69 y.o.   MRN: [id number]  Chief Complaint  Patient presents with  . New Patient (Initial Visit)    ED follow up, Home test 9/4 ED 9/23 Sx: Weakness and fatigue    Referring provider: 10/23*   69 year old male with history of chronic back pain.  Diagnosed with Covid on 09/29/2019.  HPI Patient presents today for post COVID care clinic visit/hospital follow-up.  Patient was seen in the ED on 10/18/2019 for Covid.  Chest x-ray showed diffuse interstitial prominence and patchy bibasilar consolidation consistent with pneumonia.  Patient was treated with doxycycline, Zofran, and Decadron.  Patient states that he has been doing well since ED discharge.  He states that he is improving slowly.  He still feels a little weak and short of breath with exertion at times.  He feels that this is getting better with time.  He is planning on returning to work on Monday. Denies f/c/s, n/v/d, hemoptysis, PND, chest pain or edema.    Note: patient walked in office today - O2 sats remained above 96% on RA for entire walk and heart rate was stable  No Known Allergies   There is no immunization history on file for this patient.  History reviewed. No pertinent past medical history.  Tobacco History: Social History   Tobacco Use  Smoking Status Never Smoker  Smokeless Tobacco Current User  . Types: Chew   Ready to quit: Not Answered Counseling given: Not Answered   Outpatient Encounter Medications as of 10/31/2019  Medication Sig  . oxyCODONE-acetaminophen (PERCOCET) 10-325 MG tablet Take 0.5 tablets by mouth 4 (four) times daily as needed for pain.   . ferrous gluconate (FERGON) 324 MG tablet Take 1 tablet (324 mg total) by mouth daily with breakfast. (Patient not taking: Reported on 10/31/2019)  . ondansetron (ZOFRAN ODT) 4 MG disintegrating tablet Take 1 tablet (4 mg total) by mouth every 8 (eight) hours as needed for  nausea or vomiting. (Patient not taking: Reported on 10/31/2019)  . pantoprazole (PROTONIX) 40 MG tablet Take 1 tablet (40 mg total) by mouth daily. (Patient not taking: Reported on 10/31/2019)  . polyethylene glycol powder (GLYCOLAX/MIRALAX) powder Take 17 g by mouth at bedtime. (Patient not taking: Reported on 10/31/2019)  . traMADol (ULTRAM) 50 MG tablet Take 1 tablet by mouth 2 (two) times daily as needed (for elbow pain).  (Patient not taking: Reported on 10/31/2019)  . [DISCONTINUED] doxycycline (VIBRAMYCIN) 100 MG capsule Take 1 capsule (100 mg total) by mouth 2 (two) times daily.   No facility-administered encounter medications on file as of 10/31/2019.     Review of Systems  Review of Systems  Constitutional: Negative.  Negative for fever.  HENT: Negative.   Respiratory: Positive for shortness of breath (with exertion). Negative for cough.   Cardiovascular: Negative.   Gastrointestinal: Negative.   Allergic/Immunologic: Negative.   Neurological: Negative.   Psychiatric/Behavioral: Negative.        Physical Exam  BP 114/72 (BP Location: Right Arm)   Pulse 80   Temp (!) 97.5 F (36.4 C)   Ht 5\' 9"  (1.753 m)   Wt 153 lb 0.1 oz (69.4 kg)   SpO2 98%   BMI 22.59 kg/m   Wt Readings from Last 5 Encounters:  10/31/19 153 lb 0.1 oz (69.4 kg)  10/18/19 152 lb (68.9 kg)  03/12/16 160 lb (72.6 kg)     Physical Exam Vitals and nursing note reviewed.  Constitutional:      General: He is not in acute distress.    Appearance: He is well-developed.  Cardiovascular:     Rate and Rhythm: Normal rate and regular rhythm.  Pulmonary:     Effort: Pulmonary effort is normal.     Breath sounds: Normal breath sounds.  Musculoskeletal:     Right lower leg: No edema.     Left lower leg: No edema.  Skin:    General: Skin is warm and dry.  Neurological:     Mental Status: He is alert and oriented to person, place, and time.  Psychiatric:        Mood and Affect: Mood normal.         Behavior: Behavior normal.      Imaging: DG Chest 1 View  Result Date: 10/18/2019 CLINICAL DATA:  COVID-19 positive, shortness of breath, weakness EXAM: CHEST  1 VIEW COMPARISON:  07/13/2007 FINDINGS: Single frontal view of the chest demonstrates an unremarkable cardiac silhouette. There is diffuse interstitial prominence throughout the lungs, with minimal patchy consolidation at the lung bases. No effusion or pneumothorax. No acute bony abnormalities. IMPRESSION: 1. Diffuse interstitial prominence which may reflect background scarring. 2. Patchy bibasilar consolidation consistent with superimposed bibasilar pneumonia. Electronically Signed   By: Sharlet Salina M.D.   On: 10/18/2019 17:56     Assessment & Plan:   Pneumonia due to COVID-19 virus Shortness of breath:   Stay well hydrated  Stay active  Deep breathing exercises  May start vitamin C daily, vitamin D3 daily, Zinc daily  May take tylenol or fever or pain  May take mucinex  twice daily  Will order chest x ray   Follow up:  Follow up as needed      Ivonne Andrew, NP 11/01/2019

## 2019-11-01 ENCOUNTER — Ambulatory Visit
Admission: RE | Admit: 2019-11-01 | Discharge: 2019-11-01 | Disposition: A | Discharge status: Home / Self Care | Payer: Managed Care, Other (non HMO) | Source: Ambulatory Visit | Attending: Nurse Practitioner | Admitting: Nurse Practitioner

## 2019-11-01 ENCOUNTER — Other Ambulatory Visit: Payer: Self-pay

## 2019-11-01 DIAGNOSIS — U071 COVID-19: Secondary | ICD-10-CM | POA: Insufficient documentation

## 2019-11-01 DIAGNOSIS — J1282 Pneumonia due to coronavirus disease 2019: Secondary | ICD-10-CM | POA: Insufficient documentation

## 2019-11-01 NOTE — Assessment & Plan Note (Signed)
Shortness of breath:   Stay well hydrated  Stay active  Deep breathing exercises  May start vitamin C daily, vitamin D3 daily, Zinc daily  May take tylenol or fever or pain  May take mucinex  twice daily  Will order chest x ray   Follow up:  Follow up as needed

## 2019-11-02 ENCOUNTER — Encounter: Payer: Self-pay | Admitting: Nurse Practitioner

## 2019-12-27 ENCOUNTER — Other Ambulatory Visit: Payer: Self-pay | Admitting: Nurse Practitioner

## 2019-12-27 ENCOUNTER — Ambulatory Visit (INDEPENDENT_AMBULATORY_CARE_PROVIDER_SITE_OTHER): Payer: Managed Care, Other (non HMO) | Admitting: Nurse Practitioner

## 2019-12-27 ENCOUNTER — Ambulatory Visit
Admission: RE | Admit: 2019-12-27 | Discharge: 2019-12-27 | Disposition: A | Discharge status: Home / Self Care | Payer: Managed Care, Other (non HMO) | Source: Ambulatory Visit | Attending: Nurse Practitioner | Admitting: Nurse Practitioner

## 2019-12-27 ENCOUNTER — Other Ambulatory Visit: Payer: Self-pay

## 2019-12-27 VITALS — BP 122/72 | HR 68 | Temp 97.3°F | Ht 69.0 in | Wt 165.0 lb

## 2019-12-27 DIAGNOSIS — I493 Ventricular premature depolarization: Secondary | ICD-10-CM

## 2019-12-27 DIAGNOSIS — Z8616 Personal history of COVID-19: Secondary | ICD-10-CM | POA: Diagnosis not present

## 2019-12-27 DIAGNOSIS — R9431 Abnormal electrocardiogram [ECG] [EKG]: Secondary | ICD-10-CM

## 2019-12-27 DIAGNOSIS — Z09 Encounter for follow-up examination after completed treatment for conditions other than malignant neoplasm: Secondary | ICD-10-CM

## 2019-12-27 NOTE — Patient Instructions (Addendum)
History of Covid:  Stay well hydrated  Stay active  Deep breathing exercises  May take tylenol for fever or pain  Will call with chest xray results   Irregular heart rhythm:  EKG showed: frequent PVC's - this is a new finding - will place referral to cardiology  Advised patient to stop drinking energy drinks    Follow up:  Follow up if needed

## 2019-12-27 NOTE — Progress Notes (Signed)
@Patient  ID: , male    DOB: November 28, 1950, 69 y.o.   MRN: [id number]  Chief Complaint  Patient presents with  . Follow-up    Chest discomfort with deep breaths, awaiting xray results.     Referring provider: [id number]*    69 year old male with history of chronic back pain.  Diagnosed with Covid on 09/29/2019.  HPI   Patient presents today for post COVID care clinic visit follow-up.  He did have repeat chest x-ray today but we do not have results as of this time.  We will call him with the results.  Patient states that he has been having intermittent chest pain.  Upon exam his heart rate was irregular.  EKG showed frequent PVCs.  This is new compared to previous EKGs.  Patient states that he has been having intermittent chest pain.  Patient states that he did drink 2 energy drinks earlier today.  We discussed that this could be the cause of this irregular heart rhythm.  We will refer him to cardiology for evaluation since this is a new finding on EKG. Denies f/c/s, n/v/d, hemoptysis, PND, edema.       No Known Allergies   There is no immunization history on file for this patient.  No past medical history on file.  Tobacco History: Social History   Tobacco Use  Smoking Status Never Smoker  Smokeless Tobacco Current User  . Types: Chew   Ready to quit: Not Answered Counseling given: Not Answered   Outpatient Encounter Medications as of 12/27/2019  Medication Sig  . oxyCODONE-acetaminophen (PERCOCET) 10-325 MG tablet Take 0.5 tablets by mouth 4 (four) times daily as needed for pain.   . ferrous gluconate (FERGON) 324 MG tablet Take 1 tablet (324 mg total) by mouth daily with breakfast. (Patient not taking: Reported on 10/31/2019)  . ondansetron (ZOFRAN ODT) 4 MG disintegrating tablet Take 1 tablet (4 mg total) by mouth every 8 (eight) hours as needed for nausea or vomiting. (Patient not taking: Reported on 10/31/2019)  . pantoprazole (PROTONIX) 40  MG tablet Take 1 tablet (40 mg total) by mouth daily. (Patient not taking: Reported on 10/31/2019)  . polyethylene glycol powder (GLYCOLAX/MIRALAX) powder Take 17 g by mouth at bedtime. (Patient not taking: Reported on 10/31/2019)  . traMADol (ULTRAM) 50 MG tablet Take 1 tablet by mouth 2 (two) times daily as needed (for elbow pain).  (Patient not taking: Reported on 10/31/2019)   No facility-administered encounter medications on file as of 12/27/2019.     Review of Systems  Review of Systems  Constitutional: Negative.  Negative for fatigue and fever.  HENT: Negative.   Respiratory: Negative for cough and shortness of breath.   Cardiovascular: Positive for chest pain (intermittant).  Gastrointestinal: Negative.   Allergic/Immunologic: Negative.   Neurological: Negative.   Psychiatric/Behavioral: Negative.        Physical Exam  BP 122/72 (BP Location: Right Arm)   Pulse 68   Temp (!) 97.3 F (36.3 C)   Ht 5\' 9"  (1.753 m)   Wt 165 lb 0.1 oz (74.8 kg)   SpO2 97%   BMI 24.37 kg/m   Wt Readings from Last 5 Encounters:  12/27/19 165 lb 0.1 oz (74.8 kg)  10/31/19 153 lb 0.1 oz (69.4 kg)  10/18/19 152 lb (68.9 kg)  03/12/16 160 lb (72.6 kg)     Physical Exam Vitals and nursing note reviewed.  Constitutional:      General: He is not in acute distress.  Appearance: He is well-developed.  Cardiovascular:     Rate and Rhythm: Normal rate. Rhythm irregular.  Pulmonary:     Effort: Pulmonary effort is normal.     Breath sounds: Normal breath sounds.  Musculoskeletal:     Right lower leg: No edema.     Left lower leg: No edema.  Skin:    General: Skin is warm and dry.  Neurological:     Mental Status: He is alert and oriented to person, place, and time.  Psychiatric:        Mood and Affect: Mood normal.        Behavior: Behavior normal.      Imaging: DG Chest 2 View  Result Date: 12/28/2019 CLINICAL DATA:  Previous COVID pneumonia, residual fatigue EXAM: CHEST - 2  VIEW COMPARISON:  11/01/2019 FINDINGS: The heart size and mediastinal contours are within normal limits. Both lungs are clear. The visualized skeletal structures are unremarkable. IMPRESSION: No active cardiopulmonary disease. Electronically Signed   By: Helyn Numbers MD   On: 12/28/2019 05:02     Assessment & Plan:   No problem-specific Assessment & Plan notes found for this encounter.     Ivonne Andrew, NP 12/28/2019

## 2019-12-28 DIAGNOSIS — Z8616 Personal history of COVID-19: Secondary | ICD-10-CM | POA: Insufficient documentation

## 2019-12-28 DIAGNOSIS — R9431 Abnormal electrocardiogram [ECG] [EKG]: Secondary | ICD-10-CM | POA: Insufficient documentation

## 2019-12-28 DIAGNOSIS — I493 Ventricular premature depolarization: Secondary | ICD-10-CM | POA: Insufficient documentation

## 2020-01-04 ENCOUNTER — Ambulatory Visit: Payer: Managed Care, Other (non HMO) | Admitting: Internal Medicine

## 2020-01-04 ENCOUNTER — Encounter: Payer: Self-pay | Admitting: Internal Medicine

## 2020-01-04 ENCOUNTER — Other Ambulatory Visit: Payer: Self-pay

## 2020-01-04 ENCOUNTER — Telehealth: Payer: Self-pay | Admitting: Radiology

## 2020-01-04 VITALS — BP 126/84 | HR 72 | Ht 69.0 in | Wt 162.4 lb

## 2020-01-04 DIAGNOSIS — R9431 Abnormal electrocardiogram [ECG] [EKG]: Secondary | ICD-10-CM

## 2020-01-04 DIAGNOSIS — I491 Atrial premature depolarization: Secondary | ICD-10-CM

## 2020-01-04 DIAGNOSIS — Z8616 Personal history of COVID-19: Secondary | ICD-10-CM | POA: Diagnosis not present

## 2020-01-04 NOTE — Addendum Note (Signed)
Addended by: Bertram Millard on: 01/04/2020 04:21 PM   Modules accepted: Orders

## 2020-01-04 NOTE — Progress Notes (Signed)
Cardiology Office Note:    Date:  01/04/2020   ID:  Erik Shields, DOB 05/07/1950, MRN [id number]  PCP:  [place], [place] Cardiologist:  No primary care provider on file.  [facility] Electrophysiologist:  None   CC: PACs Consulted for the evaluation of PACs at the behest of [place], [place] [facility] and Lazaro Arms NP from the [facility]  History of Present Illness:    Erik Shields is a 69 y.o. male with a hx of Frequent PVCs, prior COVID-19 infection, Tobacco Use, who presents for evaluation  Patient notes that he is feeling PACs.  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  Notes that he gets tired and a bit week.  Notes that he has positional chest pain in one of his chairs, With exertion has no chest pain, chest pressure, chest tightness, chest stinging.  Mild discomfort occurs only when sitting in a certain chair, or driving for long period of time. Improves with movement.  Diagnosed with COVID-19 PNA for 6 weeks starting September 2021.  No shortness of breath, DOE.  No syncope or near syncope. Notes no palpitations or funny heart beats, despite drinking three energy drinks a day. Feels almost back to baseline but doesn't have his strength back.  Patient reports NO prior cardiac testing including  echo,  stress test,  heart catheterizations.  Past Medical History:  Diagnosis Date  . COVID-19     Past Surgical History:  Procedure Laterality Date  . BACK SURGERY    . ESOPHAGOGASTRODUODENOSCOPY N/A 03/12/2016   Procedure: ESOPHAGOGASTRODUODENOSCOPY (EGD);  Surgeon: Carol Ada, MD;  Location: [facility] ENDOSCOPY;  Service: Endoscopy;  Laterality: N/A;    Current Medications: Current Meds  Medication Sig  . oxyCODONE-acetaminophen (PERCOCET) 10-325 MG tablet Take 0.5 tablets by mouth 4 (four) times daily as needed for pain.      Allergies:   Patient has no  known allergies.   Social History   Socioeconomic History  . Marital status: Married    Spouse name: Not on file  . Number of children: Not on file  . Years of education: Not on file  . Highest education level: Not on file  Occupational History  . Not on file  Tobacco Use  . Smoking status: Never Smoker  . Smokeless tobacco: Current User    Types: Chew  Substance and Sexual Activity  . Alcohol use: No  . Drug use: No  . Sexual activity: Not on file  Other Topics Concern  . Not on file  Social History Narrative  . Not on file   Social Determinants of Health   Financial Resource Strain: Not on file  Food Insecurity: Not on file  Transportation Needs: Not on file  Physical Activity: Not on file  Stress: Not on file  Social Connections: Not on file     Family History: The patient's family history is relevant for: History of coronary artery disease notable for mother. History of heart failure notable for mother. History of arrhythmia notable for no members. Denies family history of sudden cardiac death including drowning, car accidents, or unexplained deaths in the family  ROS:   Please see the history of present illness.     All other systems reviewed and are negative.  EKGs/Labs/Other Studies Reviewed:    The following studies were reviewed today:  EKG:  EKG is  ordered today.  The ekg ordered  today demonstrates SR rate 72 freqeuent PACs 10/22/19  SR 99 Non-specific TWI Recent Labs: 10/18/2019: BUN 18; Creatinine, Ser 0.79; Hemoglobin 14.2; Platelets 438; Potassium 3.6; Sodium 139  Recent Lipid Panel No results found for: CHOL, TRIG, HDL, CHOLHDL, VLDL, LDLCALC, LDLDIRECT   Risk Assessment/Calculations:     N/A  Physical Exam:    VS:  BP 126/84   Pulse 72   Ht 5' 9"  (1.753 m)   Wt 162 lb 6.4 oz (73.7 kg)   SpO2 96%   BMI 23.98 kg/m     Wt Readings from Last 3 Encounters:  01/04/20 162 lb 6.4 oz (73.7 kg)  12/27/19 165 lb 0.1 oz (74.8 kg)  10/31/19  153 lb 0.1 oz (69.4 kg)     GEN:  Well nourished, well developed in no acute distress HEENT: Normal NECK: No JVD; No carotid bruits LYMPHATICS: No lymphadenopathy CARDIAC: RRR, no murmurs, rubs, gallops RESPIRATORY:  Clear to auscultation without rales, wheezing or rhonchi  ABDOMEN: Soft, non-tender, non-distended MUSCULOSKELETAL:  No edema; No deformity  SKIN: Warm and dry NEUROLOGIC:  Alert and oriented x 3 PSYCHIATRIC:  Normal affect   ASSESSMENT:    1. Nonspecific abnormal electrocardiogram (ECG) (EKG)   2. History of COVID-19   3. PAC (premature atrial contraction)    PLAN:    In order of problems listed above:  Frequent PACs in the setting of COVID-19 Chest Pain - will obtain 14-day non live heart monitor (ZioPatch) - will obtain Echocardiogram - will get ESR and CRP given his positional chest pain - discussed decreasing energy drink consumption  3 month follow up unless new symptoms or abnormal test results warranting change in plan  Would be reasonable for  Virtual Follow up  Would be reasonable for  APP Follow up   Medication Adjustments/Labs and Tests Ordered: Current medicines are reviewed at length with the patient today.  Concerns regarding medicines are outlined above.  Orders Placed This Encounter  Procedures  . Sedimentation rate  . CRP High sensitivity  . LONG TERM MONITOR (3-14 DAYS)  . EKG 12-Lead  . ECHOCARDIOGRAM COMPLETE   No orders of the defined types were placed in this encounter.   Patient Instructions  Medication Instructions:  Your physician recommends that you continue on your current medications as directed. Please refer to the Current Medication list given to you today.  *If you need a refill on your cardiac medications before your next appointment, please call your pharmacy*   Lab Work: ESR and CRP  If you have labs (blood work) drawn today and your tests are completely normal, you will receive your results only  by: Marland Kitchen MyChart Message (if you have MyChart) OR . A paper copy in the mail If you have any lab test that is abnormal or we need to change your treatment, we will call you to review the results.   Testing/Procedures: Your physician has requested that you have an echocardiogram. Echocardiography is a painless test that uses sound waves to create images of your heart. It provides your doctor with information about the size and shape of your heart and how well your heart's chambers and valves are working. This procedure takes approximately one hour. There are no restrictions for this procedure.  ZIO Monitor/ Patch  Your physician has requested you wear a ZIO patch monitor for 14 days.  This is a single patch monitor. [employer] supplies one patch monitor per enrollment. Additional stickers are not available.  Please do not apply patch  if you will be having a Nuclear Stress Test, Echocardiogram, Cardiac CT, MRI, or Chest Xray during the time frame you would be wearing the monitor. The patch cannot be worn during these tests. You cannot remove and re-apply the ZIO AT patch monitor.   Your ZIO patch monitor will be sent Fed Ex from [employer] directly to your home address. The monitor may also be mailed to a PO BOX if home delivery is not available. It may take 3-5 days to receive your monitor after you have been enrolled.  Once you have received you monitor, please review enclosed instructions. Your monitor has already been registered assigning a specific monitor serial # to you.   Applying the monitor  Shave hair from upper left chest.  Hold abrader disc by orange tab. Rub abrader in 40 strokes over left upper chest as indicated in your monitor instructions.  Clean area with 4 enclosed alcohol pads. Use all pads to ensure the area is cleaned thoroughly. Let dry.  Apply patch as indicated in monitor instructions. Patch will be placed under collarbone on left side of chest with arrow pointing  upward.  Rub patch adhesive wings for 2 minutes. Remove the white label marked "1". Remove the white label marked "2". Rub patch adhesive wings for 2 additional minutes.  While looking in a mirror, press and release button in center of patch. A small green light will flash 3-4 times. This will be your only indicator the monitor has been turned on.  Do not shower for the first 24 hours. You may shower after the first 24 hours.  Press the button if you feel a symptom. You will hear a small click. Record Date, Time and Symptom in the Patient Log.  Returning your monitor  Remove your patch and place it inside the [place]. In the lower half of the [place] there is a white bag with prepaid postage on it. Place [place] in bag and seal. Mail package back to [place] as soon as possible. Your physician should have your final report approximately 7 days after you have mailed back your monitor.   Call [place] at [phone] if you have questions regarding your ZIO AT patch monitor. Call them immediately if you see an orange light blinking on your monitor.  If your monitor falls off in less than 4 days contact our Monitor department at [phone]. If your monitor becomes loose or falls off after 4 days call [employer] at [phone] for suggestions on securing your monitor.      Follow-Up: At [facility], you and your health needs are our priority.  As part of our continuing mission to provide you with exceptional heart care, we have created designated Provider Care Teams.  These Care Teams include your primary Cardiologist (physician) and Advanced Practice Providers (APPs -  Physician Assistants and Nurse Practitioners) who all work together to provide you with the care you need, when you need it.  We recommend signing up for the patient portal called "MyChart".  Sign up information is provided on this After Visit Summary.  MyChart is used to connect with patients for Virtual  Visits (Telemedicine).  Patients are able to view lab/test results, encounter notes, upcoming appointments, etc.  Non-urgent messages can be sent to your provider as well.   To learn more about what you can do with MyChart, go to [email or web address].    Your next appointment:   3 month(s)  The format for your next appointment:  In Person  Provider:   Rudean Haskell, MD   Other Instructions      Signed, Werner Lean, MD  01/04/2020 4:02 PM    [facility]

## 2020-01-04 NOTE — Patient Instructions (Addendum)
Medication Instructions:  Your physician recommends that you continue on your current medications as directed. Please refer to the Current Medication list given to you today.  *If you need a refill on your cardiac medications before your next appointment, please call your pharmacy*   Lab Work: ESR and CRP  If you have labs (blood work) drawn today and your tests are completely normal, you will receive your results only by: Marland Kitchen MyChart Message (if you have MyChart) OR . A paper copy in the mail If you have any lab test that is abnormal or we need to change your treatment, we will call you to review the results.   Testing/Procedures: Your physician has requested that you have an echocardiogram. Echocardiography is a painless test that uses sound waves to create images of your heart. It provides your doctor with information about the size and shape of your heart and how well your heart's chambers and valves are working. This procedure takes approximately one hour. There are no restrictions for this procedure.  ZIO Monitor/ Patch  Your physician has requested you wear a ZIO patch monitor for 14 days.  This is a single patch monitor. [employer] supplies one patch monitor per enrollment. Additional stickers are not available.  Please do not apply patch if you will be having a Nuclear Stress Test, Echocardiogram, Cardiac CT, MRI, or Chest Xray during the time frame you would be wearing the monitor. The patch cannot be worn during these tests. You cannot remove and re-apply the ZIO AT patch monitor.   Your ZIO patch monitor will be sent Fed Ex from [employer] directly to your home address. The monitor may also be mailed to a PO BOX if home delivery is not available. It may take 3-5 days to receive your monitor after you have been enrolled.  Once you have received you monitor, please review enclosed instructions. Your monitor has already been registered assigning a specific monitor serial # to  you.   Applying the monitor  Shave hair from upper left chest.  Hold abrader disc by orange tab. Rub abrader in 40 strokes over left upper chest as indicated in your monitor instructions.  Clean area with 4 enclosed alcohol pads. Use all pads to ensure the area is cleaned thoroughly. Let dry.  Apply patch as indicated in monitor instructions. Patch will be placed under collarbone on left side of chest with arrow pointing upward.  Rub patch adhesive wings for 2 minutes. Remove the white label marked "1". Remove the white label marked "2". Rub patch adhesive wings for 2 additional minutes.  While looking in a mirror, press and release button in center of patch. A small green light will flash 3-4 times. This will be your only indicator the monitor has been turned on.  Do not shower for the first 24 hours. You may shower after the first 24 hours.  Press the button if you feel a symptom. You will hear a small click. Record Date, Time and Symptom in the Patient Log.  Returning your monitor  Remove your patch and place it inside the [place]. In the lower half of the [place] there is a white bag with prepaid postage on it. Place [place] in bag and seal. Mail package back to [place] as soon as possible. Your physician should have your final report approximately 7 days after you have mailed back your monitor.   Call [place] at [phone] if you have questions regarding your ZIO AT patch monitor.  Call them immediately if you see an orange light blinking on your monitor.  If your monitor falls off in less than 4 days contact our Monitor department at [phone]. If your monitor becomes loose or falls off after 4 days call [employer] at [phone] for suggestions on securing your monitor.      Follow-Up: At [facility], you and your health needs are our priority.  As part of our continuing mission to provide you with exceptional heart care, we have created designated  Provider Care Teams.  These Care Teams include your primary Cardiologist (physician) and Advanced Practice Providers (APPs -  Physician Assistants and Nurse Practitioners) who all work together to provide you with the care you need, when you need it.  We recommend signing up for the patient portal called "MyChart".  Sign up information is provided on this After Visit Summary.  MyChart is used to connect with patients for Virtual Visits (Telemedicine).  Patients are able to view lab/test results, encounter notes, upcoming appointments, etc.  Non-urgent messages can be sent to your provider as well.   To learn more about what you can do with MyChart, go to [email or web address].    Your next appointment:   3 month(s)  The format for your next appointment:   In Person  Provider:   Rudean Haskell, MD   Other Instructions

## 2020-01-04 NOTE — Telephone Encounter (Signed)
Enrolled patient for a 14 day Zio XT Monitor to be mailed to patients home  

## 2020-01-05 LAB — SEDIMENTATION RATE: Sed Rate: 4 mm/hr (ref 0–30)

## 2020-01-05 LAB — HIGH SENSITIVITY CRP: CRP, High Sensitivity: 3.64 mg/L — ABNORMAL HIGH (ref 0.00–3.00)

## 2020-01-09 ENCOUNTER — Encounter: Payer: Self-pay | Admitting: Nurse Practitioner

## 2020-01-10 ENCOUNTER — Ambulatory Visit (INDEPENDENT_AMBULATORY_CARE_PROVIDER_SITE_OTHER): Payer: Managed Care, Other (non HMO)

## 2020-01-10 DIAGNOSIS — Z8616 Personal history of COVID-19: Secondary | ICD-10-CM

## 2020-01-10 DIAGNOSIS — R9431 Abnormal electrocardiogram [ECG] [EKG]: Secondary | ICD-10-CM

## 2020-01-10 DIAGNOSIS — I491 Atrial premature depolarization: Secondary | ICD-10-CM | POA: Diagnosis not present

## 2020-01-17 ENCOUNTER — Encounter: Payer: Self-pay | Admitting: *Deleted

## 2020-01-22 ENCOUNTER — Telehealth: Payer: Self-pay | Admitting: Internal Medicine

## 2020-01-22 DIAGNOSIS — R072 Precordial pain: Secondary | ICD-10-CM

## 2020-01-22 NOTE — Telephone Encounter (Signed)
Patients wife called back to speak with Rinaldo Cloud about patients results, please advise.

## 2020-01-22 NOTE — Telephone Encounter (Signed)
Erik Constant, MD  01/06/2020 1:29 PM EST      Results: Positional CP post COVID with elevation of CRP Plan: Would get CBC/BMP/ and CMR   Spoke with the patient and he gave me permission to talk with his wife. They have been notified of the results and verbalized understanding. Patient will come in on Monday 1/3 for lab work.

## 2020-01-23 ENCOUNTER — Telehealth: Payer: Self-pay | Admitting: Internal Medicine

## 2020-01-23 NOTE — Telephone Encounter (Signed)
Left message for patient to call and discuss scheduling the Cardiac MRI ordered by Dr. Chandrasekhar 

## 2020-01-28 NOTE — Telephone Encounter (Signed)
Left message for patient to call and discuss scheduling the Cardiac MRI ordered by Dr. Chandrasekhar 

## 2020-01-29 ENCOUNTER — Other Ambulatory Visit: Payer: Self-pay

## 2020-01-29 ENCOUNTER — Other Ambulatory Visit: Payer: Managed Care, Other (non HMO) | Admitting: *Deleted

## 2020-01-29 DIAGNOSIS — R072 Precordial pain: Secondary | ICD-10-CM

## 2020-01-29 NOTE — Telephone Encounter (Signed)
Left message for patient to call and discuss scheduling the Cardiac MRI ordered by Dr. Chandrasekhar 

## 2020-01-30 LAB — CBC
Hematocrit: 37.8 % (ref 37.5–51.0)
Hemoglobin: 13.1 g/dL (ref 13.0–17.7)
MCH: 33.6 pg — ABNORMAL HIGH (ref 26.6–33.0)
MCHC: 34.7 g/dL (ref 31.5–35.7)
MCV: 97 fL (ref 79–97)
Platelets: 250 10*3/uL (ref 150–450)
RBC: 3.9 x10E6/uL — ABNORMAL LOW (ref 4.14–5.80)
RDW: 13.1 % (ref 11.6–15.4)
WBC: 9.1 10*3/uL (ref 3.4–10.8)

## 2020-01-30 LAB — BASIC METABOLIC PANEL
BUN/Creatinine Ratio: 16 (ref 10–24)
BUN: 14 mg/dL (ref 8–27)
CO2: 27 mmol/L (ref 20–29)
Calcium: 9.2 mg/dL (ref 8.6–10.2)
Chloride: 102 mmol/L (ref 96–106)
Creatinine, Ser: 0.89 mg/dL (ref 0.76–1.27)
GFR calc Af Amer: 100 mL/min/{1.73_m2} (ref >59)
GFR calc non Af Amer: 87 mL/min/{1.73_m2} (ref >59)
Glucose: 85 mg/dL (ref 65–99)
Potassium: 4.1 mmol/L (ref 3.5–5.2)
Sodium: 140 mmol/L (ref 134–144)

## 2020-02-01 ENCOUNTER — Ambulatory Visit (HOSPITAL_COMMUNITY): Payer: Managed Care, Other (non HMO) | Attending: Cardiovascular Disease

## 2020-02-01 ENCOUNTER — Other Ambulatory Visit: Payer: Self-pay

## 2020-02-01 DIAGNOSIS — Z8616 Personal history of COVID-19: Secondary | ICD-10-CM

## 2020-02-01 DIAGNOSIS — R9431 Abnormal electrocardiogram [ECG] [EKG]: Secondary | ICD-10-CM | POA: Diagnosis present

## 2020-02-01 LAB — ECHOCARDIOGRAM COMPLETE
Area-P 1/2: 4.12 cm2
S' Lateral: 2.9 cm

## 2020-02-04 NOTE — Telephone Encounter (Signed)
Left message for patient to call and discuss the scheduled appointment the Cardiac MRI ordered by  Dr. Izora Ribas

## 2020-02-05 NOTE — Telephone Encounter (Signed)
Left message for patient to call and discuss scheduled appointemnt for the Cardia MRI ordered by Dr. Izora Ribas---

## 2020-02-07 ENCOUNTER — Encounter: Payer: Self-pay | Admitting: Internal Medicine

## 2020-02-07 ENCOUNTER — Telehealth: Payer: Self-pay

## 2020-02-07 MED ORDER — METOPROLOL TARTRATE 25 MG PO TABS: 12.5000 mg | ORAL_TABLET | Freq: Two times a day (BID) | ORAL | 3 refills | Status: DC

## 2020-02-07 NOTE — Telephone Encounter (Signed)
-----   Message from Christell Constant, MD sent at 02/07/2020  3:23 PM EST ----- Symptomatic PACs with short runs of SVT Will start metoprolol 12.5 mg PO BID

## 2020-02-07 NOTE — Telephone Encounter (Signed)
Left message regarding scheduled appointment Friday 03/07/20 at 8:00 am at [facility]---arrival time is 7:30 am 1st floor admissions office.  Will mail information to patient and requested he call with questions or concerns.

## 2020-02-07 NOTE — Telephone Encounter (Signed)
The patient's wife (DPR) has been notified of the result and verbalized understanding.  All questions (if any) were answered. Ethelda Chick, RN 02/07/2020 3:50 PM   Placed order for metoprolol 12.5 mg by mouth BID.

## 2020-03-06 ENCOUNTER — Telehealth (HOSPITAL_COMMUNITY): Payer: Self-pay | Admitting: *Deleted

## 2020-03-06 NOTE — Telephone Encounter (Signed)
Attempted to call patient regarding upcoming cardiac MRI appointment. Left message on voicemail with name and callback number  Ronnica Dreese RN Navigator Cardiac Imaging [facility] [phone] Office [phone] Cell  

## 2020-03-07 ENCOUNTER — Ambulatory Visit (HOSPITAL_COMMUNITY)
Admission: RE | Admit: 2020-03-07 | Discharge: 2020-03-07 | Disposition: A | Discharge status: Home / Self Care | Payer: Managed Care, Other (non HMO) | Source: Ambulatory Visit | Attending: Internal Medicine | Admitting: Internal Medicine

## 2020-03-07 ENCOUNTER — Other Ambulatory Visit: Payer: Self-pay

## 2020-03-07 DIAGNOSIS — R072 Precordial pain: Secondary | ICD-10-CM

## 2020-03-07 MED ORDER — GADOBUTROL 1 MMOL/ML IV SOLN
7.0000 mL | Freq: Once | INTRAVENOUS | Status: AC | PRN
  Administered 2020-03-07: 7 mL via INTRAVENOUS

## 2020-03-28 ENCOUNTER — Ambulatory Visit: Payer: Managed Care, Other (non HMO) | Admitting: Internal Medicine

## 2020-03-28 NOTE — Progress Notes (Deleted)
Cardiology Office Note:    Date:  03/28/2020   ID:  Erik Shields, DOB Jan 12, 1951, MRN [id number]  PCP:  [facility]  [facility] Cardiologist:  Riley Lam MD [facility] Electrophysiologist:  None   CC: Follow up post Covid PACs  History of Present Illness:    Erik Shields is a 70 y.o. male with a hx of Frequent PVCs, prior COVID-19 infection, Tobacco Use, who presents for evaluation 01/04/20.  In interim of this visit, patient had elevated CRP:  Echo CMR WNL.  Has Ziopatch showing frequent funny heart beats.  Patient notes that he is doing ***.  Since day prior/last visit notes *** changes.  Relevant interval testing or therapy include ***.  There are no*** interval hospital/ED visit.    No chest pain or pressure ***.  No SOB/DOE*** and no PND/Orthopnea***.  No weight gain or leg swelling***.  No palpitations or syncope ***.  Ambulatory blood pressure ***.   Past Medical History:  Diagnosis Date  . COVID-19     Past Surgical History:  Procedure Laterality Date  . BACK SURGERY    . ESOPHAGOGASTRODUODENOSCOPY N/A 03/12/2016   Procedure: ESOPHAGOGASTRODUODENOSCOPY (EGD);  Surgeon: Jeani Hawking, MD;  Location: [facility] ENDOSCOPY;  Service: Endoscopy;  Laterality: N/A;    Current Medications: No outpatient medications have been marked as taking for the 03/28/20 encounter (Appointment) with Christell Constant, MD.     Allergies:   Patient has no known allergies.   Social History   Socioeconomic History  . Marital status: Married    Spouse name: Not on file  . Number of children: Not on file  . Years of education: Not on file  . Highest education level: Not on file  Occupational History  . Not on file  Tobacco Use  . Smoking status: Never Smoker  . Smokeless tobacco: Current User    Types: Chew  Substance and Sexual Activity  . Alcohol use: No  . Drug use: No  . Sexual activity: Not on file  Other Topics  Concern  . Not on file  Social History Narrative  . Not on file   Social Determinants of Health   Financial Resource Strain: Not on file  Food Insecurity: Not on file  Transportation Needs: Not on file  Physical Activity: Not on file  Stress: Not on file  Social Connections: Not on file     Family History: The patient's family history is relevant for: History of coronary artery disease notable for mother. History of heart failure notable for mother. History of arrhythmia notable for no members. Denies family history of sudden cardiac death including drowning, car accidents, or unexplained deaths in the family  ROS:   Please see the history of present illness.     All other systems reviewed and are negative.  EKGs/Labs/Other Studies Reviewed:    The following studies were reviewed today:  EKG:   01/04/20: SR rate 72 freqeuent PACs 10/22/19  SR 99 Non-specific TWI  Cardiac Event Monitoring: Date: 02/05/2020 Results:  Patient had a minimum heart rate of 45 bpm, maximum heart rate of 226 bpm, and average heart rate of 74 bpm.  Predominant underlying rhythm was sinus rhythm.  Forty run of supraventricular tachycardia occurred lasting 11.1 seconds at longest with a max rate of 226 bpm at fastest.  Isolated PACs were frequent (6.8%), with rare couplets and triplets present.  Isolated PVCs were rare (<1.0%), with rare couplets present.  No evidence of complete  heart block.  Triggered and diary events associated with PACs.   Symptomatic PACs with SVT.    Transthoracic Echocardiogram: Date: 02/01/2020 Results: 1. Left ventricular ejection fraction, by estimation, is 60 to 65%. Left  ventricular ejection fraction by PLAX is 61 %. The left ventricle has  normal function. The left ventricle has no regional wall motion  abnormalities. There is mild concentric left  ventricular hypertrophy. Left ventricular diastolic parameters were  normal.  2. Right ventricular  systolic function is normal. The right ventricular  size is normal. There is normal pulmonary artery systolic pressure.  3. The mitral valve is normal in structure. Trivial mitral valve  regurgitation. No evidence of mitral stenosis.  4. The aortic valve is calcified. There is mild calcification of the  aortic valve. There is mild thickening of the aortic valve. Aortic valve  regurgitation is trivial. No aortic stenosis is present.  5. The inferior vena cava is normal in size with greater than 50%  respiratory variability, suggesting right atrial pressure of 3 mmHg.  CMR: Date:03/07/20 Results: IMPRESSION: 1.  Normal LV size and wall thickness, EF 61%. 2.  Normal RV size and systolic function, EF 60%. 3. No myocardial LGE, so no definitive evidence for prior MI, infiltrative disease, or myocarditis. 4.  Normal T2 signal   Recent Labs: 01/29/2020: BUN 14; Creatinine, Ser 0.89; Hemoglobin 13.1; Platelets 250; Potassium 4.1; Sodium 140  Recent Lipid Panel No results found for: CHOL, TRIG, HDL, CHOLHDL, VLDL, LDLCALC, LDLDIRECT   Risk Assessment/Calculations:     N/A  Physical Exam:    VS:  There were no vitals taken for this visit.    Wt Readings from Last 3 Encounters:  01/04/20 162 lb 6.4 oz (73.7 kg)  12/27/19 165 lb 0.1 oz (74.8 kg)  10/31/19 153 lb 0.1 oz (69.4 kg)     GEN:  Well nourished, well developed in no acute distress HEENT: Normal NECK: No JVD; No carotid bruits LYMPHATICS: No lymphadenopathy CARDIAC: RRR, no murmurs, rubs, gallops RESPIRATORY:  Clear to auscultation without rales, wheezing or rhonchi  ABDOMEN: Soft, non-tender, non-distended MUSCULOSKELETAL:  No edema; No deformity  SKIN: Warm and dry NEUROLOGIC:  Alert and oriented x 3 PSYCHIATRIC:  Normal affect   ASSESSMENT:    No diagnosis found. PLAN:    In order of problems listed above:  SVT PACs - - AV Nodal Therapy: *** - AAD: - EP evaluation:   - discussed energy drink  consumption  Tobacco Abuse - discussed the dangers of tobacco use, both inhaled and oral, which include, but are not limited to cardiovascular disease, increased cancer risk of multiple types of cancer, COPD, peripheral arterial disease, strokes. - counseled on the benefits of smoking cessation. - firmly advised to quit.  - we also reviewed strategies to maximize success, including:  Removing cigarettes and smoking materials from environment   Stress management  Substitution of other forms of reinforcement (the one cigarette a day approach)  Support of family/friends and group smoking cessation  Selecting a quit date  Patient provided contact information for [employer] or 1-800-QUIT-NOW  Patient provided with [place]'s 8 free smoking cessation classes: (336) 5160905489 and [email or web address]  -*** Patient has no disordered sleep, insomnia, abnormal dreams, no suicidal ideation on no antidepressant medications, ***CrCL gerater than 30 and would like to try varenicline.  Discussed risks, including nausea and sleep issues, and benefits and will trial a 12 week course: 0.5 mg once daily for three days, then 0.5 mg twice daily  for four days, and then 1 mg twice daily for the remainder of a 12-week course. -*** Patient has had no history of seizure, no suicidal ideation and no antidepressant medications,and would like to try bupropion.  Discussed risk, including seizure threshold/insomnia/dry mouth/headache, and benefits and will trial a 12 week course:  150 mg/day for three days, then 150 mg twice daily.   3 month follow up unless new symptoms or abnormal test results warranting change in plan  Would be reasonable for  Virtual Follow up  Would be reasonable for  APP Follow up   Medication Adjustments/Labs and Tests Ordered: Current medicines are reviewed at length with the patient today.  Concerns regarding medicines are outlined above.  No orders of the defined types were  placed in this encounter.  No orders of the defined types were placed in this encounter.   There are no Patient Instructions on file for this visit.   Signed, Christell Constant, MD  03/28/2020 1:06 PM    [facility]

## 2021-02-15 ENCOUNTER — Other Ambulatory Visit: Payer: Self-pay | Admitting: Internal Medicine

## 2021-05-15 ENCOUNTER — Other Ambulatory Visit: Payer: Self-pay | Admitting: Internal Medicine

## 2021-06-02 ENCOUNTER — Other Ambulatory Visit: Payer: Self-pay | Admitting: Internal Medicine

## 2021-07-14 ENCOUNTER — Other Ambulatory Visit: Payer: Self-pay | Admitting: Internal Medicine

## 2021-07-17 ENCOUNTER — Encounter: Payer: Self-pay | Admitting: Nurse Practitioner

## 2021-07-17 ENCOUNTER — Ambulatory Visit (INDEPENDENT_AMBULATORY_CARE_PROVIDER_SITE_OTHER): Payer: Managed Care, Other (non HMO) | Admitting: Nurse Practitioner

## 2021-07-17 VITALS — BP 128/80 | HR 62 | Ht 69.0 in | Wt 168.2 lb

## 2021-07-17 DIAGNOSIS — R9431 Abnormal electrocardiogram [ECG] [EKG]: Secondary | ICD-10-CM | POA: Diagnosis not present

## 2021-07-17 DIAGNOSIS — R002 Palpitations: Secondary | ICD-10-CM | POA: Diagnosis not present

## 2021-07-17 DIAGNOSIS — I491 Atrial premature depolarization: Secondary | ICD-10-CM

## 2021-08-20 ENCOUNTER — Other Ambulatory Visit: Payer: Self-pay | Admitting: Internal Medicine

## 2022-01-20 IMAGING — MR MR CARD MORPHOLOGY WO/W CM
43 of 48 series · 43 of 48 positions shown · IV contrast (gadavist)
Comparison: none

CLINICAL DATA: YPBEB-M1 infection, ?myocarditis

EXAM:
CARDIAC MRI
TECHNIQUE: The patient was scanned on a 1.5 Tesla GE magnet. A dedicated
cardiac coil was used. Functional imaging was done using Fiesta
sequences. [DATE], and 4 chamber views were done to assess for RWMA's.
Modified Odisei rule using a short axis stack was used to
calculate an ejection fraction on a dedicated work station using
Circle software. The patient received 8 cc of Gadavist. After 10
minutes inversion recovery sequences were used to assess for
infiltration and scar tissue.

[Series 4: t2_haste_db_tra_bh · axial · 8.0mm · 1.41mm/px · 1 of 20 slices shown]
[im 1/20]
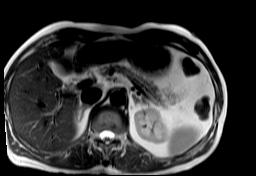

[Series 9: bSSFP · oblique · 8.0mm · 1.52mm/px · 1 of 25 slices shown (1 of 19)]
[im 1/25]
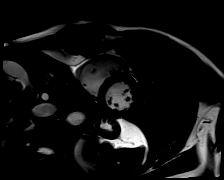

[Series 9: bSSFP · oblique · 8.0mm · 1.52mm/px · 1 of 25 slices shown (2 of 19)]
[im 1/25]
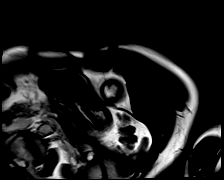

[Series 9: bSSFP · oblique · 8.0mm · 1.52mm/px · 1 of 25 slices shown (3 of 19)]
[im 1/25]
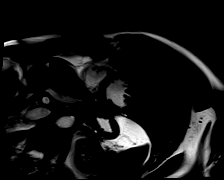

[Series 9: bSSFP · oblique · 8.0mm · 1.52mm/px · 1 of 25 slices shown (4 of 19)]
[im 1/25]
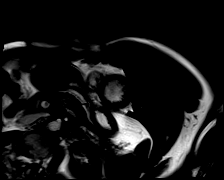

[Series 9: bSSFP · oblique · 8.0mm · 1.52mm/px · 1 of 25 slices shown (5 of 19)]
[im 1/25]
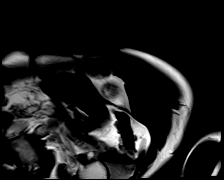

[Series 9: bSSFP · oblique · 8.0mm · 1.52mm/px · 1 of 25 slices shown (6 of 19)]
[im 1/25]
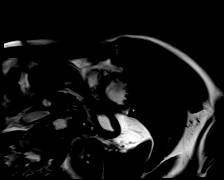

[Series 9: bSSFP · oblique · 8.0mm · 1.52mm/px · 1 of 25 slices shown (7 of 19)]
[im 1/25]
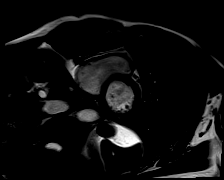

[Series 9: bSSFP · oblique · 8.0mm · 1.52mm/px · 1 of 25 slices shown (8 of 19)]
[im 1/25]
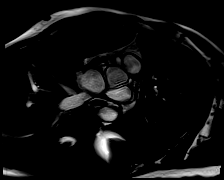

[Series 9: bSSFP · oblique · 8.0mm · 1.52mm/px · 1 of 25 slices shown (9 of 19)]
[im 1/25]
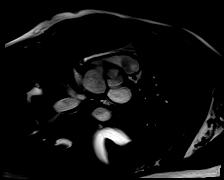

[Series 9: bSSFP · oblique · 8.0mm · 1.52mm/px · 1 of 25 slices shown (10 of 19)]
[im 1/25]
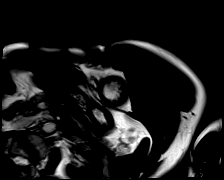

[Series 9: bSSFP · oblique · 8.0mm · 1.52mm/px · 1 of 25 slices shown (11 of 19)]
[im 1/25]
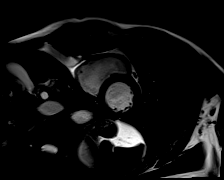

[Series 9: bSSFP · oblique · 8.0mm · 1.52mm/px · 1 of 25 slices shown (12 of 19)]
[im 1/25]
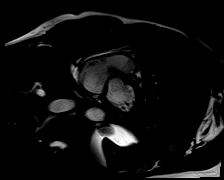

[Series 9: bSSFP · oblique · 8.0mm · 1.52mm/px · 1 of 25 slices shown (13 of 19)]
[im 1/25]
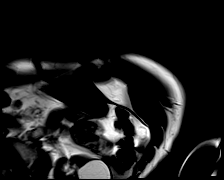

[Series 9: bSSFP · oblique · 8.0mm · 1.52mm/px · 1 of 25 slices shown (14 of 19)]
[im 1/25]
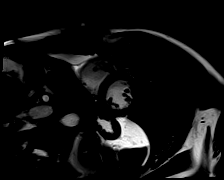

[Series 9: bSSFP · oblique · 8.0mm · 1.52mm/px · 1 of 25 slices shown (15 of 19)]
[im 1/25]
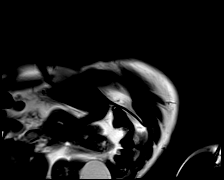

[Series 10: (id)_long_t1 · oblique · 8.0mm · 1.41mm/px · 1 of 24 slices shown]
[im 1/24]
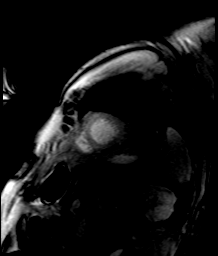

[Series 11: (id)_long_t1_moco · oblique · 8.0mm · 1.41mm/px · 1 of 24 slices shown]
[im 1/24]
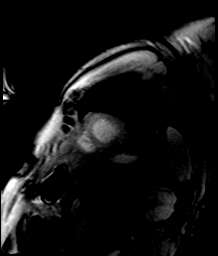

[Series 14: bSSFP · oblique · 6.0mm · 1.41mm/px · 1 of 25 slices shown (16 of 19)]
[im 1/25]
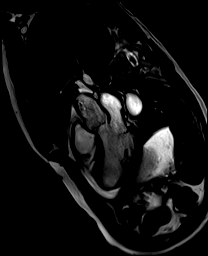

[Series 15: bSSFP · oblique · 6.0mm · 1.41mm/px · 1 of 25 slices shown (17 of 19)]
[im 1/25]
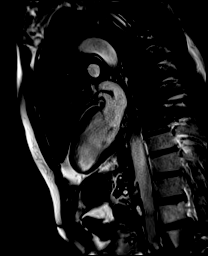

[Series 17: (id)_trufi · oblique · 8.0mm · 2.08mm/px · 1 of 9 slices shown]
[im 1/9]
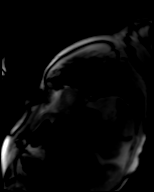

[Series 18: (id)_trufi_moco · oblique · 8.0mm · 2.08mm/px · 1 of 9 slices shown]
[im 1/9]
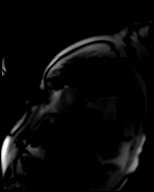

[Series 19: (id)_trufi_moco_t2 · oblique · 8.0mm · 2.08mm/px · 1 of 3 slices shown]
[im 1/3]
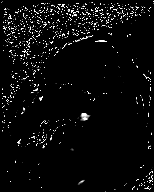

[Series 22: pre short axis · oblique · non-contrast · 8.0mm · 2.25mm/px · 1 of 10 slices shown (1 of 6)]
[im 1/10]
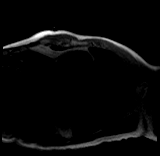

[Series 23: pre short axis · oblique · non-contrast · 8.0mm · 2.25mm/px · 1 of 10 slices shown (2 of 6)]
[im 1/10]
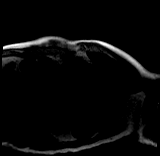

[Series 24: pre short axis · oblique · non-contrast · 8.0mm · 2.25mm/px · 1 of 10 slices shown (3 of 6)]
[im 1/10]
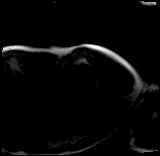

[Series 25: pre short axis · oblique · non-contrast · 8.0mm · 2.25mm/px · 1 of 10 slices shown (4 of 6)]
[im 1/10]
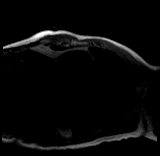

[Series 26: pre short axis · oblique · non-contrast · 8.0mm · 2.25mm/px · 1 of 10 slices shown (5 of 6)]
[im 1/10]
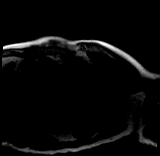

[Series 27: pre short axis · oblique · non-contrast · 8.0mm · 2.25mm/px · 1 of 10 slices shown (6 of 6)]
[im 1/10]
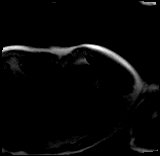

[Series 30: bSSFP · oblique · 6.0mm · 1.33mm/px · 1 of 25 slices shown (18 of 19)]
[im 1/25]
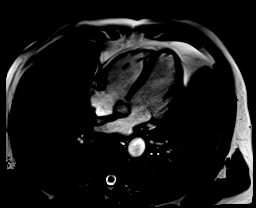

[Series 31: rest short axis · oblique · 8.0mm · 2.25mm/px · 1 of 60 slices shown (1 of 6)]
[im 1/60]
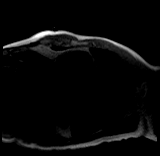

[Series 32: rest short axis · oblique · 8.0mm · 2.25mm/px · 1 of 60 slices shown (2 of 6)]
[im 1/60]
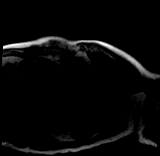

[Series 33: rest short axis · oblique · 8.0mm · 2.25mm/px · 1 of 60 slices shown (3 of 6)]
[im 1/60]
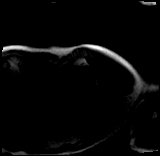

[Series 34: rest short axis · oblique · 8.0mm · 2.25mm/px · 1 of 60 slices shown (4 of 6)]
[im 1/60]
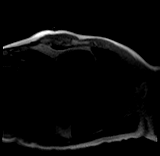

[Series 35: rest short axis · oblique · 8.0mm · 2.25mm/px · 1 of 60 slices shown (5 of 6)]
[im 1/60]
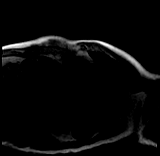

[Series 36: rest short axis · oblique · 8.0mm · 2.25mm/px · 1 of 60 slices shown (6 of 6)]
[im 1/60]
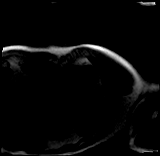

[Series 37: bSSFP · coronal · 6.0mm · 1.41mm/px · 1 of 25 slices shown (19 of 19)]
[im 1/25]
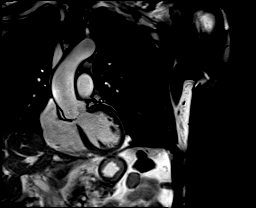

[Series 38: aortic valve cine · oblique · 6.0mm · 1.41mm/px · 1 of 25 slices shown]
[im 1/25]
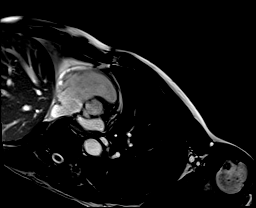

[Series 39: cine rvit · coronal · 6.0mm · 1.41mm/px · 1 of 25 slices shown]
[im 1/25]
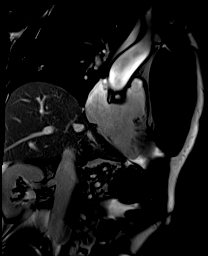

[Series 40: cine rvot · sagittal · 6.0mm · 1.41mm/px · 1 of 25 slices shown]
[im 1/25]
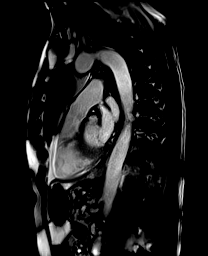

[Series 42: lge_single shot sa · oblique · 8.0mm · 2.08mm/px · 1 of 15 slices shown (1 of 2)]
[im 1/15]
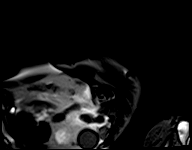

[Series 43: lge_single shot sa · oblique · 8.0mm · 2.08mm/px · 1 of 15 slices shown (2 of 2)]
[im 1/15]
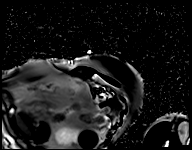

[Series 52: (id)_short_t1 · oblique · 8.0mm · 2.08mm/px · 1 of 27 slices shown]
[im 1/27]
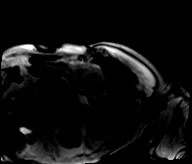

[43 of 48 positions shown; findings below may reference images not displayed]

FINDINGS: Limited images of the lung fields showed no gross abnormalities.

Normal left ventricular size and wall thickness. Normal wall motion,
LV EF 61%. Normal right ventricular size and systolic function, EF
60%. Normal left and right atrial sizes. Trivial mitral
regurgitation. Trileaflet aortic valve with no regurgitation or
stenosis.

On delayed enhancement imaging there was no myocardial late
gadolinium enhancement (LGE) noted.

Measurements:

LVEDV 116 mL
LVSV 71 mL
LVEF 61%

RVEDV 140 mL
RVSV 84 mL
RVEF 60%

T2 44 septum, 48 lateral wall.
IMPRESSION: 1.  Normal LV size and wall thickness, EF 61%.

2.  Normal RV size and systolic function, EF 60%.

3. No myocardial LGE, so no definitive evidence for prior MI,
infiltrative disease, or myocarditis.

4.  Normal T2 signal

Ourari Tiger

## 2022-08-23 ENCOUNTER — Other Ambulatory Visit: Payer: Self-pay | Admitting: Nurse Practitioner

## 2022-08-25 ENCOUNTER — Other Ambulatory Visit: Payer: Self-pay | Admitting: Internal Medicine

## 2022-09-22 ENCOUNTER — Other Ambulatory Visit: Payer: Self-pay | Admitting: Internal Medicine

## 2022-11-15 ENCOUNTER — Other Ambulatory Visit: Payer: Self-pay | Admitting: Internal Medicine

## 2023-06-03 DIAGNOSIS — M545 Low back pain, unspecified: Secondary | ICD-10-CM | POA: Diagnosis not present

## 2023-06-03 DIAGNOSIS — N529 Male erectile dysfunction, unspecified: Secondary | ICD-10-CM | POA: Diagnosis not present

## 2023-06-03 DIAGNOSIS — G8929 Other chronic pain: Secondary | ICD-10-CM | POA: Diagnosis not present

## 2023-06-03 DIAGNOSIS — F1120 Opioid dependence, uncomplicated: Secondary | ICD-10-CM | POA: Diagnosis not present

## 2023-12-19 ENCOUNTER — Other Ambulatory Visit: Payer: Self-pay

## 2023-12-19 ENCOUNTER — Emergency Department (HOSPITAL_COMMUNITY): Payer: Medicare (Managed Care)

## 2023-12-19 ENCOUNTER — Inpatient Hospital Stay (HOSPITAL_COMMUNITY)
Admission: EM | Admit: 2023-12-19 | Discharge: 2023-12-21 | DRG: 322 | Disposition: A | Discharge status: Home / Self Care | Payer: Medicare (Managed Care) | Attending: Family Medicine | Admitting: Family Medicine

## 2023-12-19 DIAGNOSIS — I214 Non-ST elevation (NSTEMI) myocardial infarction: Principal | ICD-10-CM | POA: Diagnosis present

## 2023-12-19 DIAGNOSIS — Z955 Presence of coronary angioplasty implant and graft: Secondary | ICD-10-CM

## 2023-12-19 DIAGNOSIS — I2510 Atherosclerotic heart disease of native coronary artery without angina pectoris: Secondary | ICD-10-CM | POA: Diagnosis present

## 2023-12-19 DIAGNOSIS — F1210 Cannabis abuse, uncomplicated: Secondary | ICD-10-CM | POA: Diagnosis present

## 2023-12-19 DIAGNOSIS — Z79899 Other long term (current) drug therapy: Secondary | ICD-10-CM

## 2023-12-19 DIAGNOSIS — K649 Unspecified hemorrhoids: Secondary | ICD-10-CM | POA: Diagnosis present

## 2023-12-19 DIAGNOSIS — I252 Old myocardial infarction: Secondary | ICD-10-CM

## 2023-12-19 DIAGNOSIS — Z8249 Family history of ischemic heart disease and other diseases of the circulatory system: Secondary | ICD-10-CM

## 2023-12-19 DIAGNOSIS — R011 Cardiac murmur, unspecified: Secondary | ICD-10-CM | POA: Diagnosis present

## 2023-12-19 DIAGNOSIS — R079 Chest pain, unspecified: Secondary | ICD-10-CM | POA: Diagnosis not present

## 2023-12-19 DIAGNOSIS — Z7982 Long term (current) use of aspirin: Secondary | ICD-10-CM

## 2023-12-19 DIAGNOSIS — Z8616 Personal history of COVID-19: Secondary | ICD-10-CM

## 2023-12-19 DIAGNOSIS — F17220 Nicotine dependence, chewing tobacco, uncomplicated: Secondary | ICD-10-CM | POA: Diagnosis present

## 2023-12-19 DIAGNOSIS — E782 Mixed hyperlipidemia: Secondary | ICD-10-CM | POA: Diagnosis present

## 2023-12-19 DIAGNOSIS — D72829 Elevated white blood cell count, unspecified: Secondary | ICD-10-CM | POA: Diagnosis present

## 2023-12-19 LAB — BASIC METABOLIC PANEL WITH GFR
Anion gap: 9 (ref 5–15)
BUN: 11 mg/dL (ref 8–23)
CO2: 28 mmol/L (ref 22–32)
Calcium: 9.8 mg/dL (ref 8.9–10.3)
Chloride: 104 mmol/L (ref 98–111)
Creatinine, Ser: 0.88 mg/dL (ref 0.61–1.24)
GFR, Estimated: >60 mL/min (ref >60)
Glucose, Bld: 112 mg/dL — ABNORMAL HIGH (ref 70–99)
Potassium: 3.8 mmol/L (ref 3.5–5.1)
Sodium: 141 mmol/L (ref 135–145)

## 2023-12-19 LAB — CBC
HCT: 41.1 % (ref 39.0–52.0)
Hemoglobin: 14 g/dL (ref 13.0–17.0)
MCH: 33.3 pg (ref 26.0–34.0)
MCHC: 34.1 g/dL (ref 30.0–36.0)
MCV: 97.9 fL (ref 80.0–100.0)
Platelets: 267 K/uL (ref 150–400)
RBC: 4.2 MIL/uL — ABNORMAL LOW (ref 4.22–5.81)
RDW: 13.1 % (ref 11.5–15.5)
WBC: 13.5 K/uL — ABNORMAL HIGH (ref 4.0–10.5)
nRBC: 0 % (ref 0.0–0.2)

## 2023-12-19 LAB — TROPONIN T, HIGH SENSITIVITY
Troponin T High Sensitivity: 236 ng/L (ref 0–19)
Troponin T High Sensitivity: 277 ng/L (ref 0–19)

## 2023-12-19 MED ORDER — HEPARIN BOLUS VIA INFUSION
4000.0000 [IU] | Freq: Once | INTRAVENOUS | Status: AC
  Administered 2023-12-19: 4000 [IU] via INTRAVENOUS
  Filled 2023-12-19: qty 4000

## 2023-12-19 MED ORDER — ASPIRIN 81 MG PO CHEW
324.0000 mg | CHEWABLE_TABLET | Freq: Once | ORAL | Status: AC
  Administered 2023-12-19: 324 mg via ORAL
  Filled 2023-12-19: qty 4

## 2023-12-19 MED ORDER — HEPARIN (PORCINE) 25000 UT/250ML-% IV SOLN
1050.0000 [IU]/h | INTRAVENOUS | Status: DC
  Administered 2023-12-19: 900 [IU]/h via INTRAVENOUS
  Filled 2023-12-19: qty 250

## 2023-12-19 NOTE — ED Provider Notes (Signed)
  [place] EMERGENCY DEPARTMENT AT [facility] Provider Note   CSN: [id number] Arrival date & time: 12/19/23  1949     Patient presents with: Chest Pain   Erik Shields is a 73 y.o. male.  {Add pertinent medical, surgical, social history, OB history to HPI:32947} HPI     Burning center of chest for the last 3 days, no radiation Coming and going Worse with exertion Not having it right now No shortness of breath, no vomiting Nausea No diaphoresis Very now and then lightheadedness  no fever, cough or abd pain  Prior to Admission medications   Medication Sig Start Date End Date Taking? Authorizing Provider  hydrocortisone 2.5 % lotion Apply topically daily. 07/03/21   [provider]  metoprolol  tartrate (LOPRESSOR ) 25 MG tablet Take 0.5 tablets (12.5 mg total) by mouth 2 (two) times daily. Please call our office to schedule an overdue appointment with Dr. Santo before anymore refills. [phone]. Thank you . 3rd attempt. 09/22/22   Chandrasekhar, Stanly LABOR, MD  oxyCODONE-acetaminophen  (PERCOCET) 10-325 MG tablet Take 0.5 tablets by mouth 4 (four) times daily as needed for pain.  02/08/16   [provider]    Allergies: Patient has no known allergies.    Review of Systems  Updated Vital Signs BP (!) 153/102 (BP Location: Right Arm)   Pulse 70   Temp 97.9 F (36.6 C) (Oral)   Resp 18   SpO2 100%   Physical Exam  (all labs ordered are listed, but only abnormal results are displayed) Labs Reviewed  BASIC METABOLIC PANEL WITH GFR - Abnormal; Notable for the following components:      Result Value   Glucose, Bld 112 (*)    All other components within normal limits  CBC - Abnormal; Notable for the following components:   WBC 13.5 (*)    RBC 4.20 (*)    All other components within normal limits  TROPONIN T, HIGH SENSITIVITY - Abnormal; Notable for the following components:   Troponin T High Sensitivity 236 (*)    All other  components within normal limits    EKG: EKG Interpretation Date/Time:  Monday December 19 2023 19:56:52 EST Ventricular Rate:  68 PR Interval:  181 QRS Duration:  93 QT Interval:  393 QTC Calculation: 418 R Axis:   63  Text Interpretation: Sinus rhythm Consider left atrial enlargement No significant change since last tracing Confirmed by Dreama Longs ([id number]) on 12/19/2023 9:11:34 PM  Radiology: No results found.  {Document cardiac monitor, telemetry assessment procedure when appropriate:32947} Procedures   Medications Ordered in the ED - No data to display    {Click here for ABCD2, HEART and other calculators REFRESH Note before signing:1}                              Medical Decision Making Amount and/or Complexity of Data Reviewed Labs: ordered.   ***  {Document critical care time when appropriate  Document review of labs and clinical decision tools ie CHADS2VASC2, etc  Document your independent review of radiology images and any outside records  Document your discussion with family members, caretakers and with consultants  Document social determinants of health affecting pt's care  Document your decision making why or why not admission, treatments were needed:32947:::1}   Final diagnoses:  None    ED Discharge Orders     None

## 2023-12-19 NOTE — ED Notes (Signed)
 I did contact lab again due to it being sent 20 minutes ago, she stated she never received it. I will be redrawing the lab and walking it down to lab.

## 2023-12-19 NOTE — ED Triage Notes (Signed)
 Patient c/o mid burning chest pain for about 4 days now. Patient stated that he feels like he wanted to make himself vomit and when he burps it fells better but then it will start to hurt again. 1/10 pain now.

## 2023-12-19 NOTE — Progress Notes (Signed)
 PHARMACY - ANTICOAGULATION CONSULT NOTE  Pharmacy Consult for heparin  Indication: chest pain/ACS  No Known Allergies  Patient Measurements: Height: 5' 9 (175.3 cm) Weight: 76.3 kg (168 lb 3.4 oz) IBW/kg (Calculated) : 70.7 HEPARIN  DW (KG): 76.3  Vital Signs: Temp: 97.9 F (36.6 C) (11/24 1954) Temp Source: Oral (11/24 1954) BP: 153/102 (11/24 1954) Pulse Rate: 70 (11/24 1954)  Labs: Recent Labs    12/19/23 2006  HGB 14.0  HCT 41.1  PLT 267  CREATININE 0.88    Estimated Creatinine Clearance: 73.6 mL/min (by C-G formula based on SCr of 0.88 mg/dL).   Medical History: Past Medical History:  Diagnosis Date   COVID-19      Assessment: 73 yo male presents with  c/o mid burning chest pain for about 4 days now. Pharmacy to dose heparin  for ACS/STEMI, no prior anti-coagulation noted  CBC WNL, Scr 0.88  Goal of Therapy:  Heparin  level 0.3-0.7 units/ml Monitor platelets by anticoagulation protocol: Yes   Plan:  Heparin  bolus 4000 units x 1 Start heparin  drip at 900 units/hr Heparin  level in 8 hours Daily CBC   Leeroy Mace RPh 12/19/2023, 9:32 PM

## 2023-12-19 NOTE — ED Notes (Signed)
 MD notified of pt troph

## 2023-12-19 NOTE — ED Notes (Signed)
 Troph was walked down to lab

## 2023-12-19 NOTE — ED Notes (Signed)
 Contacted the lab due to no troph running. They stated they had not received the troph.

## 2023-12-20 ENCOUNTER — Encounter (HOSPITAL_COMMUNITY)
Admission: EM | Disposition: A | Discharge status: Home / Self Care | Payer: Self-pay | Source: Home / Self Care | Attending: Internal Medicine

## 2023-12-20 ENCOUNTER — Encounter (HOSPITAL_COMMUNITY): Payer: Self-pay | Admitting: Internal Medicine

## 2023-12-20 ENCOUNTER — Inpatient Hospital Stay (HOSPITAL_COMMUNITY): Payer: Medicare (Managed Care)

## 2023-12-20 DIAGNOSIS — E782 Mixed hyperlipidemia: Secondary | ICD-10-CM | POA: Diagnosis not present

## 2023-12-20 DIAGNOSIS — I214 Non-ST elevation (NSTEMI) myocardial infarction: Secondary | ICD-10-CM | POA: Diagnosis not present

## 2023-12-20 DIAGNOSIS — F17220 Nicotine dependence, chewing tobacco, uncomplicated: Secondary | ICD-10-CM | POA: Diagnosis not present

## 2023-12-20 DIAGNOSIS — Z7982 Long term (current) use of aspirin: Secondary | ICD-10-CM | POA: Diagnosis not present

## 2023-12-20 DIAGNOSIS — Z8616 Personal history of COVID-19: Secondary | ICD-10-CM | POA: Diagnosis not present

## 2023-12-20 DIAGNOSIS — I2510 Atherosclerotic heart disease of native coronary artery without angina pectoris: Secondary | ICD-10-CM

## 2023-12-20 DIAGNOSIS — F1210 Cannabis abuse, uncomplicated: Secondary | ICD-10-CM | POA: Diagnosis not present

## 2023-12-20 DIAGNOSIS — Z79899 Other long term (current) drug therapy: Secondary | ICD-10-CM | POA: Diagnosis not present

## 2023-12-20 DIAGNOSIS — D72829 Elevated white blood cell count, unspecified: Secondary | ICD-10-CM | POA: Diagnosis not present

## 2023-12-20 DIAGNOSIS — I252 Old myocardial infarction: Secondary | ICD-10-CM | POA: Diagnosis not present

## 2023-12-20 DIAGNOSIS — R011 Cardiac murmur, unspecified: Secondary | ICD-10-CM | POA: Diagnosis not present

## 2023-12-20 DIAGNOSIS — Z8249 Family history of ischemic heart disease and other diseases of the circulatory system: Secondary | ICD-10-CM | POA: Diagnosis not present

## 2023-12-20 HISTORY — PX: CORONARY STENT INTERVENTION: CATH118234

## 2023-12-20 HISTORY — PX: LEFT HEART CATH AND CORONARY ANGIOGRAPHY: CATH118249

## 2023-12-20 LAB — POCT ACTIVATED CLOTTING TIME
Activated Clotting Time: 279 s
Activated Clotting Time: 245 s

## 2023-12-20 LAB — CBC
HCT: 43.7 % (ref 39.0–52.0)
Hemoglobin: 14.7 g/dL (ref 13.0–17.0)
MCH: 32.7 pg (ref 26.0–34.0)
MCHC: 33.6 g/dL (ref 30.0–36.0)
MCV: 97.3 fL (ref 80.0–100.0)
Platelets: 263 K/uL (ref 150–400)
RBC: 4.49 MIL/uL (ref 4.22–5.81)
RDW: 13.2 % (ref 11.5–15.5)
WBC: 13 K/uL — ABNORMAL HIGH (ref 4.0–10.5)
nRBC: 0 % (ref 0.0–0.2)

## 2023-12-20 LAB — HEPARIN LEVEL (UNFRACTIONATED): Heparin Unfractionated: 0.26 [IU]/mL — ABNORMAL LOW (ref 0.30–0.70)

## 2023-12-20 MED ORDER — ENSURE PLUS HIGH PROTEIN PO LIQD
237.0000 mL | Freq: Two times a day (BID) | ORAL | Status: DC
  Administered 2023-12-21: 237 mL via ORAL

## 2023-12-20 MED ORDER — LABETALOL HCL 5 MG/ML IV SOLN: 10.0000 mg | INTRAVENOUS | Status: AC | PRN

## 2023-12-20 MED ORDER — FREE WATER
500.0000 mL | Freq: Once | Status: AC
  Administered 2023-12-20: 500 mL via ORAL

## 2023-12-20 MED ORDER — VERAPAMIL HCL 2.5 MG/ML IV SOLN
INTRAVENOUS | Status: AC
Start: 2023-12-20 — End: 2023-12-20
  Filled 2023-12-20: qty 2

## 2023-12-20 MED ORDER — SODIUM CHLORIDE 0.9% FLUSH
3.0000 mL | Freq: Two times a day (BID) | INTRAVENOUS | Status: DC
  Administered 2023-12-20 – 2023-12-21 (×2): 3 mL via INTRAVENOUS

## 2023-12-20 MED ORDER — LIDOCAINE HCL (PF) 1 % IJ SOLN
INTRAMUSCULAR | Status: AC
  Filled 2023-12-20: qty 30

## 2023-12-20 MED ORDER — ASPIRIN 300 MG RE SUPP
300.0000 mg | RECTAL | Status: AC
  Filled 2023-12-20: qty 1

## 2023-12-20 MED ORDER — VERAPAMIL HCL 2.5 MG/ML IV SOLN
INTRAVENOUS | Status: DC | PRN
  Administered 2023-12-20: 10 mL via INTRA_ARTERIAL

## 2023-12-20 MED ORDER — HEPARIN SODIUM (PORCINE) 1000 UNIT/ML IJ SOLN
INTRAMUSCULAR | Status: DC | PRN
  Administered 2023-12-20 (×2): 4000 [IU] via INTRAVENOUS
  Administered 2023-12-20: 2000 [IU] via INTRAVENOUS
  Administered 2023-12-20: 4000 [IU] via INTRAVENOUS

## 2023-12-20 MED ORDER — ACETAMINOPHEN 325 MG PO TABS: 650.0000 mg | ORAL_TABLET | ORAL | Status: DC | PRN

## 2023-12-20 MED ORDER — MIDAZOLAM HCL 2 MG/2ML IJ SOLN
INTRAMUSCULAR | Status: AC
  Filled 2023-12-20: qty 2

## 2023-12-20 MED ORDER — TICAGRELOR 90 MG PO TABS
ORAL_TABLET | ORAL | Status: DC | PRN
  Administered 2023-12-20: 180 mg via ORAL

## 2023-12-20 MED ORDER — FENTANYL CITRATE (PF) 100 MCG/2ML IJ SOLN
INTRAMUSCULAR | Status: AC
  Filled 2023-12-20: qty 2

## 2023-12-20 MED ORDER — ALBUTEROL SULFATE (2.5 MG/3ML) 0.083% IN NEBU: 2.5000 mg | INHALATION_SOLUTION | RESPIRATORY_TRACT | Status: DC | PRN

## 2023-12-20 MED ORDER — TICAGRELOR 90 MG PO TABS
ORAL_TABLET | ORAL | Status: AC
  Filled 2023-12-20: qty 2

## 2023-12-20 MED ORDER — HEPARIN BOLUS VIA INFUSION
1000.0000 [IU] | Freq: Once | INTRAVENOUS | Status: AC
  Administered 2023-12-20: 1000 [IU] via INTRAVENOUS
  Filled 2023-12-20: qty 1000

## 2023-12-20 MED ORDER — ASPIRIN 81 MG PO TBEC: 81.0000 mg | DELAYED_RELEASE_TABLET | Freq: Every day | ORAL | Status: DC

## 2023-12-20 MED ORDER — HEPARIN SODIUM (PORCINE) 1000 UNIT/ML IJ SOLN
INTRAMUSCULAR | Status: AC
  Filled 2023-12-20: qty 10

## 2023-12-20 MED ORDER — ASPIRIN 81 MG PO CHEW: 324.0000 mg | CHEWABLE_TABLET | ORAL | Status: AC

## 2023-12-20 MED ORDER — METOPROLOL TARTRATE 12.5 MG HALF TABLET
12.5000 mg | ORAL_TABLET | Freq: Two times a day (BID) | ORAL | Status: DC
  Administered 2023-12-20 – 2023-12-21 (×3): 12.5 mg via ORAL
  Filled 2023-12-20 (×3): qty 1

## 2023-12-20 MED ORDER — ASPIRIN 81 MG PO TBEC
81.0000 mg | DELAYED_RELEASE_TABLET | Freq: Every day | ORAL | Status: DC
  Administered 2023-12-20 – 2023-12-21 (×2): 81 mg via ORAL
  Filled 2023-12-20 (×2): qty 1

## 2023-12-20 MED ORDER — NITROGLYCERIN 1 MG/10 ML FOR IR/CATH LAB
INTRA_ARTERIAL | Status: DC | PRN
  Administered 2023-12-20 (×3): 150 ug via INTRACORONARY

## 2023-12-20 MED ORDER — NITROGLYCERIN 0.4 MG SL SUBL
0.4000 mg | SUBLINGUAL_TABLET | SUBLINGUAL | Status: DC | PRN
Start: 2023-12-20 — End: 2023-12-21

## 2023-12-20 MED ORDER — MORPHINE SULFATE (PF) 2 MG/ML IV SOLN
1.0000 mg | INTRAVENOUS | Status: DC | PRN
  Administered 2023-12-20: 1 mg via INTRAVENOUS
  Filled 2023-12-20: qty 1

## 2023-12-20 MED ORDER — TRAZODONE HCL 50 MG PO TABS: 25.0000 mg | ORAL_TABLET | Freq: Every evening | ORAL | Status: DC | PRN

## 2023-12-20 MED ORDER — IOHEXOL 350 MG/ML SOLN
INTRAVENOUS | Status: DC | PRN
  Administered 2023-12-20: 145 mL

## 2023-12-20 MED ORDER — HYDRALAZINE HCL 20 MG/ML IJ SOLN: 10.0000 mg | INTRAMUSCULAR | Status: AC | PRN

## 2023-12-20 MED ORDER — LIDOCAINE HCL (PF) 1 % IJ SOLN
INTRAMUSCULAR | Status: DC | PRN
  Administered 2023-12-20: 2 mL

## 2023-12-20 MED ORDER — HEPARIN (PORCINE) IN NACL 1000-0.9 UT/500ML-% IV SOLN
INTRAVENOUS | Status: DC | PRN
  Administered 2023-12-20: 1000 mL

## 2023-12-20 MED ORDER — MIDAZOLAM HCL 2 MG/2ML IJ SOLN
INTRAMUSCULAR | Status: AC
Start: 2023-12-20 — End: 2023-12-20
  Filled 2023-12-20: qty 2

## 2023-12-20 MED ORDER — NITROGLYCERIN 1 MG/10 ML FOR IR/CATH LAB
INTRA_ARTERIAL | Status: AC
  Filled 2023-12-20: qty 10

## 2023-12-20 MED ORDER — ROSUVASTATIN CALCIUM 20 MG PO TABS
40.0000 mg | ORAL_TABLET | Freq: Every day | ORAL | Status: DC
  Administered 2023-12-20 – 2023-12-21 (×2): 40 mg via ORAL
  Filled 2023-12-20 (×2): qty 2

## 2023-12-20 MED ORDER — TICAGRELOR 90 MG PO TABS
90.0000 mg | ORAL_TABLET | Freq: Two times a day (BID) | ORAL | Status: DC
  Administered 2023-12-20 – 2023-12-21 (×2): 90 mg via ORAL
  Filled 2023-12-20 (×2): qty 1

## 2023-12-20 MED ORDER — ONDANSETRON HCL 4 MG/2ML IJ SOLN
4.0000 mg | Freq: Four times a day (QID) | INTRAMUSCULAR | Status: DC | PRN
Start: 2023-12-20 — End: 2023-12-21

## 2023-12-20 MED ORDER — SODIUM CHLORIDE 0.9 % IV SOLN
INTRAVENOUS | Status: AC | PRN
  Administered 2023-12-20: 10 mL/h via INTRAVENOUS

## 2023-12-20 MED ORDER — SODIUM CHLORIDE 0.9 % IV SOLN: 250.0000 mL | INTRAVENOUS | Status: DC | PRN

## 2023-12-20 MED ORDER — FENTANYL CITRATE (PF) 100 MCG/2ML IJ SOLN
INTRAMUSCULAR | Status: DC | PRN
  Administered 2023-12-20: 50 ug via INTRAVENOUS
  Administered 2023-12-20 (×2): 25 ug via INTRAVENOUS

## 2023-12-20 MED ORDER — MIDAZOLAM HCL (PF) 2 MG/2ML IJ SOLN
INTRAMUSCULAR | Status: DC | PRN
  Administered 2023-12-20 (×3): 1 mg via INTRAVENOUS

## 2023-12-20 MED ORDER — SODIUM CHLORIDE 0.9% FLUSH: 3.0000 mL | INTRAVENOUS | Status: DC | PRN

## 2023-12-20 NOTE — Progress Notes (Signed)
 Echo attempted, pt not in room and at cath  [facility], RDCS 1:21 PM 12/20/2023

## 2023-12-20 NOTE — Interval H&P Note (Signed)
 History and Physical Interval Note:  12/20/2023 2:44 PM  Erik Shields  has presented today for surgery, with the diagnosis of nstemi.  The various methods of treatment have been discussed with the patient and family. After consideration of risks, benefits and other options for treatment, the patient has consented to  Procedure(s): LEFT HEART CATH AND CORONARY ANGIOGRAPHY (N/A) as a surgical intervention.  The patient's history has been reviewed, patient examined, no change in status, stable for surgery.  I have reviewed the patient's chart and labs.  Questions were answered to the patient's satisfaction.     [name]

## 2023-12-20 NOTE — ED Notes (Signed)
 Patient is resting comfortably.

## 2023-12-20 NOTE — Progress Notes (Signed)
 PHARMACY - ANTICOAGULATION CONSULT NOTE  Pharmacy Consult for heparin  Indication: chest pain/ACS  No Known Allergies  Patient Measurements: Height: 5' 9 (175.3 cm) Weight: 76.3 kg (168 lb 3.4 oz) IBW/kg (Calculated) : 70.7 HEPARIN  DW (KG): 76.3  Vital Signs: Temp: 97.8 F (36.6 C) (11/25 1136) Temp Source: Oral (11/25 1136) BP: 125/79 (11/25 1136) Pulse Rate: 60 (11/25 1136)  Labs: Recent Labs    12/19/23 2006 12/20/23 0546  HGB 14.0 14.7  HCT 41.1 43.7  PLT 267 263  HEPARINUNFRC  --  0.26*  CREATININE 0.88  --     Estimated Creatinine Clearance: 73.6 mL/min (by C-G formula based on SCr of 0.88 mg/dL).   Medical History: Past Medical History:  Diagnosis Date   COVID-19      Assessment: 73 yo male presents with c/o mid burning chest pain for about 4 days now. Pharmacy to dose heparin  for ACS/STEMI, no prior anti-coagulation noted.  Cardiology planning for left heart cath.  Today, -Heparin  level 0.26 - subtherapeutic with heparin  infusing at 900 units/hr -CBC stable -No complications of therapy noted  Goal of Therapy:  Heparin  level 0.3-0.7 units/ml Monitor platelets by anticoagulation protocol: Yes   Plan:  -Heparin  bolus 1000 units x 1 -Increase heparin  infusion to 1050 units/hr -Heparin  level in 8 hours -Daily CBC  Erik Shields, PharmD, BCPS Clinical Pharmacist 12/20/2023 11:58 AM

## 2023-12-20 NOTE — H&P (View-Only) (Signed)
 Cardiology Consultation   Patient ID: Erik Shields MRN: [id number]; DOB: May 03, 1950  Admit date: 12/19/2023 Date of Consult: 12/20/2023  PCP:  [facility]   [facility] Providers Cardiologist:  Erik DELENA Leavens, MD        Patient Profile: Erik Shields is a 73 y.o. male with a hx of hypercholesterolemia, SVT, PACs, and tobacco abuse who is being seen 12/20/2023 for the evaluation of chest pain and elevated troponins at the request of Mir Zella MD.  History of Present Illness: Erik Shields is a 73 year old male with prior cardiac history listed below  Patient was initially seen by Dr. Leavens on 12/2019 for chest pain and PACs.  Cardiac monitor revealed symptomatic PACs and short runs of SVT.  The patient was started on metoprolol  12.5 mg twice daily.  His symptoms improved after he started taking metoprolol .  Echocardiogram on 01/2020 showed a normal LVEF of 60 to 65%, no RWMA, mild concentric LVH, normal pulmonary artery systolic pressure, normal RV systolic function, mild aortic valve calcification, and grossly normal valve function.  A cardiac MRI was done on 02/2020 that showed normal LVEF, normal RV systolic function, no myocardial LGE, or evidence of prior MI.  Patient presented to the emergency department for  chest pain.  On interview patient reported that his substernal chest pain is worse with exertion and better with rest.  He has had this chest pain for the past 4 days.  Stated that the pain has been extremely severe at times.  Denies any ongoing chest pain since arriving at the hospital.  He works in a insurance claims handler.  Reports drinking 1 alcoholic drink every few months.  Chews tobacco.  Reported that he takes a capsule that likely contains delta 8 for his back every now and then.   Labs showed high-sensitivity troponin 236 > 277, creatinine of 0.88, potassium of 3.8, calcium  of 9.8, WBC count of 13, and  hemoglobin of 14.7.  Chest x-ray showed no acute cardiopulmonary process  EKG showed normal sinus rhythm with a rate of 72, and  biphasic T waves in leads V2 through V4.    Past Medical History:  Diagnosis Date   COVID-19     Past Surgical History:  Procedure Laterality Date   BACK SURGERY     ESOPHAGOGASTRODUODENOSCOPY N/A 03/12/2016   Procedure: ESOPHAGOGASTRODUODENOSCOPY (EGD);  Surgeon: Belvie Just, MD;  Location: [facility] ENDOSCOPY;  Service: Endoscopy;  Laterality: N/A;     Home Medications:  Prior to Admission medications   Medication Sig Start Date End Date Taking? Authorizing Provider  oxyCODONE-acetaminophen  (PERCOCET) 10-325 MG tablet Take 1 tablet by mouth 2 (two) times daily as needed for pain. 02/08/16  Yes [provider]  rosuvastatin  (CRESTOR ) 10 MG tablet Take 10 mg by mouth daily. 11/29/23 05/27/24 Yes [provider]  metoprolol  tartrate (LOPRESSOR ) 25 MG tablet Take 0.5 tablets (12.5 mg total) by mouth 2 (two) times daily. Please call our office to schedule an overdue appointment with Dr. Leavens before anymore refills. [phone]. Thank you . 3rd attempt. Patient not taking: Reported on 12/20/2023 09/22/22   Shields Erik DELENA, MD    Scheduled Meds:  aspirin   324 mg Oral NOW   Or   aspirin   300 mg Rectal NOW   [START ON 12/21/2023] aspirin  EC  81 mg Oral Daily   metoprolol  tartrate  12.5 mg Oral BID   rosuvastatin   40 mg Oral Daily   Continuous Infusions:  heparin  900 Units/hr (  12/20/23 0602)   PRN Meds: acetaminophen , albuterol , morphine  injection, nitroGLYCERIN , ondansetron  (ZOFRAN ) IV, traZODone   Allergies:   No Known Allergies  Social History:   Social History   Socioeconomic History   Marital status: Married    Spouse name: Not on file   Number of children: Not on file   Years of education: Not on file   Highest education level: Not on file  Occupational History   Not on file  Tobacco Use   Smoking status: Never    Smokeless tobacco: Current    Types: Chew  Substance and Sexual Activity   Alcohol use: No   Drug use: No   Sexual activity: Not on file  Other Topics Concern   Not on file  Social History Narrative   Not on file   Social Drivers of Health   Financial Resource Strain: Not on file  Food Insecurity: Low Risk  (11/25/2023)   Received from [facility]   Hunger Vital Sign    Within the past 12 months, you worried that your food would run out before you got money to buy more: Never true    Within the past 12 months, the food you bought just didn't last and you didn't have money to get more. : Never true  Transportation Needs: No Transportation Needs (11/25/2023)   Received from [facility]    In the past 12 months, has lack of reliable transportation kept you from medical appointments, meetings, work or from getting things needed for daily living? : No  Physical Activity: Not on file  Stress: Not on file  Social Connections: Not on file  Intimate Partner Violence: Not on file    Family History:    Family History  Problem Relation Age of Onset   Heart failure Mother      ROS:  Please see the history of present illness.   All other ROS reviewed and negative.     Physical Exam/Data: Vitals:   12/20/23 0602 12/20/23 0643 12/20/23 0700 12/20/23 0805  BP: (!) 141/89  (!) 130/95 (!) 140/91  Pulse: 76  69 75  Resp: 15  18   Temp:  98.5 F (36.9 C)    TempSrc:  Oral    SpO2: 96%  96%   Weight:      Height:        Intake/Output Summary (Last 24 hours) at 12/20/2023 0932 Last data filed at 12/20/2023 0602 Gross per 24 hour  Intake 112.7 ml  Output --  Net 112.7 ml      12/19/2023    9:00 PM 07/17/2021   11:21 AM 01/04/2020    3:25 PM  Last 3 Weights  Weight (lbs) 168 lb 3.4 oz 168 lb 3.2 oz 162 lb 6.4 oz  Weight (kg) 76.3 kg 76.295 kg 73.664 kg     Body mass index is 24.84 kg/m.  General:  Well nourished, well developed, in no acute  distress.  Alert and orientated on room air HEENT: normal Neck: no JVD Vascular: No carotid bruits; Distal pulses 2+ bilaterally Cardiac:  normal S1, S2; RRR; 2 out of 6 murmur heard best at the apex. Lungs:  clear to auscultation bilaterally, no wheezing, rhonchi or rales  Abd: soft, nontender, no hepatomegaly  Ext: no edema Musculoskeletal:  No deformities Skin: warm and dry  Neuro:   no focal abnormalities noted Psych:  Normal affect   EKG:  The EKG was personally reviewed and demonstrates:   normal sinus  rhythm with a rate of 72, and  biphasic T waves in leads V2 through V4. Telemetry:  Telemetry was personally reviewed and demonstrates: Normal sinus rhythm with resting heart rates in the 60s.  Relevant CV Studies: Echo pending  Laboratory Data: High Sensitivity Troponin:  No results for input(s): TROPONINIHS in the last 720 hours.   Chemistry Recent Labs  Lab 12/19/23 2006  NA 141  K 3.8  CL 104  CO2 28  GLUCOSE 112*  BUN 11  CREATININE 0.88  CALCIUM  9.8  GFRNONAA >60  ANIONGAP 9    No results for input(s): PROT, ALBUMIN, AST, ALT, ALKPHOS, BILITOT in the last 168 hours. Lipids No results for input(s): CHOL, TRIG, HDL, LABVLDL, LDLCALC, CHOLHDL in the last 168 hours.  Hematology Recent Labs  Lab 12/19/23 2006 12/20/23 0546  WBC 13.5* 13.0*  RBC 4.20* 4.49  HGB 14.0 14.7  HCT 41.1 43.7  MCV 97.9 97.3  MCH 33.3 32.7  MCHC 34.1 33.6  RDW 13.1 13.2  PLT 267 263   Thyroid No results for input(s): TSH, FREET4 in the last 168 hours.  BNPNo results for input(s): BNP, PROBNP in the last 168 hours.  DDimer No results for input(s): DDIMER in the last 168 hours.  Radiology/Studies:  DG Chest 2 View Result Date: 12/19/2023 EXAM: 2 VIEW(S) XRAY OF THE CHEST 12/19/2023 09:12:00 PM COMPARISON: 12/27/2019 CLINICAL HISTORY: Chest pain FINDINGS: LUNGS AND PLEURA: Hyperinflation. No focal infiltrate is noted. No pleural effusion. No  pneumothorax. HEART AND MEDIASTINUM: No acute abnormality of the cardiac and mediastinal silhouettes. BONES AND SOFT TISSUES: Degenerative changes of the spine. IMPRESSION: 1. No acute cardiopulmonary process. Electronically signed by: Oneil Devonshire MD 12/19/2023 09:16 PM EST RP Workstation: [id number]     Assessment and Plan: Erik Shields is a 73 y.o. male with a hx of hypercholesterolemia, SVT, PACs, and tobacco abuse who is being seen 12/20/2023 for the evaluation of chest pain and elevated troponins at the request of Mir Zella MD.  NSTEMI Hyperlipidemia Presented to the emergency department for 4 days of substernal chest pain.  The pain is worse with exertion and better when he rests.  Denies any ongoing pain since arriving at the hospital.  Denies any shortness of breath, orthopnea, lower extremity edema. - high-sensitivity troponin 236 > 277, creatinine of 0.88. - EKG showed normal sinus rhythm with a rate of 72, and  biphasic T waves in leads V2 through V4. The patient had an elevated blood pressure overnight as high as 178/97.  Blood pressure has been improving slightly most recent BP at 8 AM was 140/91. Received 324 mg chewable aspirin  and was started on 81 mg aspirin  daily.  Was also started on IV heparin . Lipid panel and LPA pending. Echocardiogram pending Continue IV heparin  Continue aspirin  81 mg daily Increase Crestor  to 40 mg daily. Continue metoprolol  tartrate 12.5 mg twice daily Plan to transfer patient to [facility] for a left heart cath. Informed Consent   Shared Decision Making/Informed Consent The risks [stroke (1 in 1000), death (1 in 1000), kidney failure [usually temporary] (1 in 500), bleeding (1 in 200), allergic reaction [possibly serious] (1 in 200)], benefits (diagnostic support and management of coronary artery disease) and alternatives of a cardiac catheterization were discussed in detail with Mr. Camacho and he is willing to proceed.      PACs Cardiac  monitor that was ordered by Dr. Santo revealed symptomatic PACs and short runs of SVT.  The patient was started on metoprolol  12.5  mg twice daily. Continue metoprolol  tartrate 12.5 mg twice daily   Tobacco abuse Cannabis abuse  Chews tobacco.  Reported that he takes a capsule that likely contains delta 8 for his back every now and then.  Recommend cessation   Heart murmur - Echo pending   Otherwise management per primary   Risk Assessment/Risk Scores:  TIMI Risk Score for Unstable Angina or Non-ST Elevation MI:   The patient's TIMI risk score is 3, which indicates a 13% risk of all cause mortality, new or recurrent myocardial infarction or need for urgent revascularization in the next 14 days.  For questions or updates, please contact [facility] Please consult [email or web address] for contact info under   Signed, Zane Adams, PA-C  12/20/2023 9:32 AM\  Patient seen and examined, note reviewed with the signed Advanced Practice Provider. I personally reviewed laboratory data, imaging studies and relevant notes. I independently examined the patient and formulated the important aspects of the plan. I have personally discussed the plan with the patient and/or family. Comments or changes to the note/plan are indicated below.  Patient seen examined by his bedside.  His wife was by the bedside when I arrived.  He tells me he still has had some chest discomfort resolving though.  He is concerned about this.  NSTEMI Hyperlipidemia Tobacco use SVT PACs  Agree with the heparin  drip.  Continue this.  He has been started on aspirin  81 mg daily, rosuvastatin  as well as metoprolol  to tartrate.  Agree with the plan for left heart catheterization today.  Informed Consent   Shared Decision Making/Informed Consent The risks [stroke (1 in 1000), death (1 in 1000), kidney failure [usually temporary] (1 in 500), bleeding (1 in 200), allergic reaction [possibly serious] (1 in 200)],  benefits (diagnostic support and management of coronary artery disease) and alternatives of a cardiac catheterization were discussed in detail with Mr. Borner and he is willing to proceed.      Echocardiogram pending.  Tobacco cessation advised   Caylin Raby DO, MS [facility] Attending Cardiologist [facility]  [address] [place], [place] [address] [phone] Website: [email or web address]

## 2023-12-20 NOTE — H&P (Signed)
 History and Physical  Erik Shields FMW:[id number] DOB: 1950-09-02 DOA: 12/19/2023  [facility]P: [facility]   Chief Complaint: Chest pain  HPI: Erik Shields is a 73 y.o. male with medical history significant for SVT, hyperlipidemia being admitted to the hospital with NSTEMI.  Patient states he was in his usual state of health until about 4 days ago when he started having burning chest discomfort, not associated with position, activity, not pleuritic.  There was some minimal associated nausea, but no shortness of breath, dizziness, palpitations.  He recently was prescribed a statin, and thought this was the cause of the chest discomfort, and discontinued this medication.  On workup here in the emergency department, patient was found to have elevated troponin, cardiology was consulted and the patient was started on IV heparin  drip.  Review of Systems: Please see HPI for pertinent positives and negatives. A complete 10 system review of systems are otherwise negative.  Past Medical History:  Diagnosis Date   COVID-19    Past Surgical History:  Procedure Laterality Date   BACK SURGERY     ESOPHAGOGASTRODUODENOSCOPY N/A 03/12/2016   Procedure: ESOPHAGOGASTRODUODENOSCOPY (EGD);  Surgeon: Belvie Just, MD;  Location: [facility] ENDOSCOPY;  Service: Endoscopy;  Laterality: N/A;   Social History:  reports that he has never smoked. His smokeless tobacco use includes chew. He reports that he does not drink alcohol and does not use drugs.  No Known Allergies  Family History  Problem Relation Age of Onset   Heart failure Mother      Prior to Admission medications   Medication Sig Start Date End Date Taking? Authorizing Provider  oxyCODONE-acetaminophen  (PERCOCET) 10-325 MG tablet Take 1 tablet by mouth 2 (two) times daily as needed for pain. 02/08/16  Yes [provider]  rosuvastatin  (CRESTOR ) 10 MG tablet Take 10 mg by mouth daily. 11/29/23 05/27/24 Yes  [provider]  metoprolol  tartrate (LOPRESSOR ) 25 MG tablet Take 0.5 tablets (12.5 mg total) by mouth 2 (two) times daily. Please call our office to schedule an overdue appointment with Dr. Santo before anymore refills. [phone]. Thank you . 3rd attempt. Patient not taking: Reported on 12/20/2023 09/22/22   Santo Stanly LABOR, MD    Physical Exam: BP (!) 130/95   Pulse 69   Temp 98.5 F (36.9 C) (Oral)   Resp 18   Ht 5' 9 (1.753 m)   Wt 76.3 kg   SpO2 96%   BMI 24.84 kg/m  General:  Alert, oriented, calm, in no acute distress  Eyes: EOMI, clear conjuctivae, white sclerea Neck: supple, no masses, trachea mildline  Cardiovascular: RRR, no murmurs or rubs, no peripheral edema  Respiratory: clear to auscultation bilaterally, no wheezes, no crackles  Abdomen: soft, nontender, nondistended, normal bowel tones heard  Skin: dry, no rashes  Musculoskeletal: no joint effusions, normal range of motion  Psychiatric: appropriate affect, normal speech  Neurologic: extraocular muscles intact, clear speech, moving all extremities with intact sensorium         Labs on Admission:  Basic Metabolic Panel: Recent Labs  Lab 12/19/23 2006  NA 141  K 3.8  CL 104  CO2 28  GLUCOSE 112*  BUN 11  CREATININE 0.88  CALCIUM  9.8   Liver Function Tests: No results for input(s): AST, ALT, ALKPHOS, BILITOT, PROT, ALBUMIN in the last 168 hours. No results for input(s): LIPASE, AMYLASE in the last 168 hours. No results for input(s): AMMONIA in the last 168 hours. CBC: Recent Labs  Lab  12/19/23 2006 12/20/23 0546  WBC 13.5* 13.0*  HGB 14.0 14.7  HCT 41.1 43.7  MCV 97.9 97.3  PLT 267 263   Cardiac Enzymes: No results for input(s): CKTOTAL, CKMB, CKMBINDEX, TROPONINI in the last 168 hours. BNP (last 3 results) No results for input(s): BNP in the last 8760 hours.  ProBNP (last 3 results) No results for input(s): PROBNP in the last 8760  hours.  CBG: No results for input(s): GLUCAP in the last 168 hours.  Radiological Exams on Admission: DG Chest 2 View Result Date: 12/19/2023 EXAM: 2 VIEW(S) XRAY OF THE CHEST 12/19/2023 09:12:00 PM COMPARISON: 12/27/2019 CLINICAL HISTORY: Chest pain FINDINGS: LUNGS AND PLEURA: Hyperinflation. No focal infiltrate is noted. No pleural effusion. No pneumothorax. HEART AND MEDIASTINUM: No acute abnormality of the cardiac and mediastinal silhouettes. BONES AND SOFT TISSUES: Degenerative changes of the spine. IMPRESSION: 1. No acute cardiopulmonary process. Electronically signed by: Oneil Devonshire MD 12/19/2023 09:16 PM EST RP Workstation: [id number]   Assessment/Plan Erik Shields is a 73 y.o. male with medical history significant for SVT, hyperlipidemia being admitted to the hospital with NSTEMI.  NSTEMI-with burning chest pain, elevated troponin. -Inpatient admission -Telemetry monitoring -ASA -IV heparin  drip -Continue home Lopressor  -Cardiology has been consulted -2D echo  Hyperlipidemia-Crestor   Leukocytosis-no signs or symptoms of infection, suspect this is stress demargination from his cardiac process    Code Status: Full Code  Consults called: Cardiology  Admission status: The appropriate patient status for this patient is INPATIENT. Inpatient status is judged to be reasonable and necessary in order to provide the required intensity of service to ensure the patient's safety. The patient's presenting symptoms, physical exam findings, and initial radiographic and laboratory data in the context of their chronic comorbidities is felt to place them at high risk for further clinical deterioration. Furthermore, it is not anticipated that the patient will be medically stable for discharge from the hospital within 2 midnights of admission.    I certify that at the point of admission it is my clinical judgment that the patient will require inpatient hospital care spanning beyond 2  midnights from the point of admission due to high intensity of service, high risk for further deterioration and high frequency of surveillance required  Time spent: 59 minutes  Awad Gladd CHRISTELLA Gail MD [facility] Pager [phone]  If 7PM-7AM, please contact night-coverage [email or web address] Password [email or web address]  12/20/2023, 7:54 AM

## 2023-12-20 NOTE — Consult Note (Addendum)
 Cardiology Consultation   Patient ID: Erik Shields MRN: [id number]; DOB: 12-14-1950  Admit date: 12/19/2023 Date of Consult: 12/20/2023  PCP:  [facility] Providers Cardiologist:  Erik DELENA Leavens, MD        Patient Profile: Erik Shields is a 73 y.o. male with a hx of hypercholesterolemia, SVT, PACs, and tobacco abuse who is being seen 12/20/2023 for the evaluation of chest pain and elevated troponins at the request of Mir Zella MD.  History of Present Illness: Erik Shields is a 73 year old male with prior cardiac history listed below  Patient was initially seen by Dr. Leavens on 12/2019 for chest pain and PACs.  Cardiac monitor revealed symptomatic PACs and short runs of SVT.  The patient was started on metoprolol  12.5 mg twice daily.  His symptoms improved after he started taking metoprolol .  Echocardiogram on 01/2020 showed a normal LVEF of 60 to 65%, no RWMA, mild concentric LVH, normal pulmonary artery systolic pressure, normal RV systolic function, mild aortic valve calcification, and grossly normal valve function.  A cardiac MRI was done on 02/2020 that showed normal LVEF, normal RV systolic function, no myocardial LGE, or evidence of prior MI.  Patient presented to the emergency department for  chest pain.  On interview patient reported that his substernal chest pain is worse with exertion and better with rest.  He has had this chest pain for the past 4 days.  Stated that the pain has been extremely severe at times.  Denies any ongoing chest pain since arriving at the hospital.  He works in a insurance claims handler.  Reports drinking 1 alcoholic drink every few months.  Chews tobacco.  Reported that he takes a capsule that likely contains delta 8 for his back every now and then.   Labs showed high-sensitivity troponin 236 > 277, creatinine of 0.88, potassium of 3.8, calcium  of 9.8, WBC count of 13, and  hemoglobin of 14.7.  Chest x-ray showed no acute cardiopulmonary process  EKG showed normal sinus rhythm with a rate of 72, and  biphasic T waves in leads V2 through V4.    Past Medical History:  Diagnosis Date   COVID-19     Past Surgical History:  Procedure Laterality Date   BACK SURGERY     ESOPHAGOGASTRODUODENOSCOPY N/A 03/12/2016   Procedure: ESOPHAGOGASTRODUODENOSCOPY (EGD);  Surgeon: Belvie Just, MD;  Location: [facility] ENDOSCOPY;  Service: Endoscopy;  Laterality: N/A;     Home Medications:  Prior to Admission medications   Medication Sig Start Date End Date Taking? Authorizing Provider  oxyCODONE-acetaminophen  (PERCOCET) 10-325 MG tablet Take 1 tablet by mouth 2 (two) times daily as needed for pain. 02/08/16  Yes [provider]  rosuvastatin  (CRESTOR ) 10 MG tablet Take 10 mg by mouth daily. 11/29/23 05/27/24 Yes [provider]  metoprolol  tartrate (LOPRESSOR ) 25 MG tablet Take 0.5 tablets (12.5 mg total) by mouth 2 (two) times daily. Please call our office to schedule an overdue appointment with Dr. Leavens before anymore refills. [phone]. Thank you . 3rd attempt. Patient not taking: Reported on 12/20/2023 09/22/22   Shields Erik DELENA, MD    Scheduled Meds:  aspirin   324 mg Oral NOW   Or   aspirin   300 mg Rectal NOW   [START ON 12/21/2023] aspirin  EC  81 mg Oral Daily   metoprolol  tartrate  12.5 mg Oral BID   rosuvastatin   40 mg Oral Daily   Continuous Infusions:  heparin  900 Units/hr (  12/20/23 0602)   PRN Meds: acetaminophen , albuterol , morphine  injection, nitroGLYCERIN , ondansetron  (ZOFRAN ) IV, traZODone   Allergies:   No Known Allergies  Social History:   Social History   Socioeconomic History   Marital status: Married    Spouse name: Not on file   Number of children: Not on file   Years of education: Not on file   Highest education level: Not on file  Occupational History   Not on file  Tobacco Use   Smoking status: Never    Smokeless tobacco: Current    Types: Chew  Substance and Sexual Activity   Alcohol use: No   Drug use: No   Sexual activity: Not on file  Other Topics Concern   Not on file  Social History Narrative   Not on file   Social Drivers of Health   Financial Resource Strain: Not on file  Food Insecurity: Low Risk  (11/25/2023)   Received from [facility]   Hunger Vital Sign    Within the past 12 months, you worried that your food would run out before you got money to buy more: Never true    Within the past 12 months, the food you bought just didn't last and you didn't have money to get more. : Never true  Transportation Needs: No Transportation Needs (11/25/2023)   Received from [facility]    In the past 12 months, has lack of reliable transportation kept you from medical appointments, meetings, work or from getting things needed for daily living? : No  Physical Activity: Not on file  Stress: Not on file  Social Connections: Not on file  Intimate Partner Violence: Not on file    Family History:    Family History  Problem Relation Age of Onset   Heart failure Mother      ROS:  Please see the history of present illness.   All other ROS reviewed and negative.     Physical Exam/Data: Vitals:   12/20/23 0602 12/20/23 0643 12/20/23 0700 12/20/23 0805  BP: (!) 141/89  (!) 130/95 (!) 140/91  Pulse: 76  69 75  Resp: 15  18   Temp:  98.5 F (36.9 C)    TempSrc:  Oral    SpO2: 96%  96%   Weight:      Height:        Intake/Output Summary (Last 24 hours) at 12/20/2023 0932 Last data filed at 12/20/2023 0602 Gross per 24 hour  Intake 112.7 ml  Output --  Net 112.7 ml      12/19/2023    9:00 PM 07/17/2021   11:21 AM 01/04/2020    3:25 PM  Last 3 Weights  Weight (lbs) 168 lb 3.4 oz 168 lb 3.2 oz 162 lb 6.4 oz  Weight (kg) 76.3 kg 76.295 kg 73.664 kg     Body mass index is 24.84 kg/m.  General:  Well nourished, well developed, in no acute  distress.  Alert and orientated on room air HEENT: normal Neck: no JVD Vascular: No carotid bruits; Distal pulses 2+ bilaterally Cardiac:  normal S1, S2; RRR; 2 out of 6 murmur heard best at the apex. Lungs:  clear to auscultation bilaterally, no wheezing, rhonchi or rales  Abd: soft, nontender, no hepatomegaly  Ext: no edema Musculoskeletal:  No deformities Skin: warm and dry  Neuro:   no focal abnormalities noted Psych:  Normal affect   EKG:  The EKG was personally reviewed and demonstrates:   normal sinus  rhythm with a rate of 72, and  biphasic T waves in leads V2 through V4. Telemetry:  Telemetry was personally reviewed and demonstrates: Normal sinus rhythm with resting heart rates in the 60s.  Relevant CV Studies: Echo pending  Laboratory Data: High Sensitivity Troponin:  No results for input(s): TROPONINIHS in the last 720 hours.   Chemistry Recent Labs  Lab 12/19/23 2006  NA 141  K 3.8  CL 104  CO2 28  GLUCOSE 112*  BUN 11  CREATININE 0.88  CALCIUM  9.8  GFRNONAA >60  ANIONGAP 9    No results for input(s): PROT, ALBUMIN, AST, ALT, ALKPHOS, BILITOT in the last 168 hours. Lipids No results for input(s): CHOL, TRIG, HDL, LABVLDL, LDLCALC, CHOLHDL in the last 168 hours.  Hematology Recent Labs  Lab 12/19/23 2006 12/20/23 0546  WBC 13.5* 13.0*  RBC 4.20* 4.49  HGB 14.0 14.7  HCT 41.1 43.7  MCV 97.9 97.3  MCH 33.3 32.7  MCHC 34.1 33.6  RDW 13.1 13.2  PLT 267 263   Thyroid No results for input(s): TSH, FREET4 in the last 168 hours.  BNPNo results for input(s): BNP, PROBNP in the last 168 hours.  DDimer No results for input(s): DDIMER in the last 168 hours.  Radiology/Studies:  DG Chest 2 View Result Date: 12/19/2023 EXAM: 2 VIEW(S) XRAY OF THE CHEST 12/19/2023 09:12:00 PM COMPARISON: 12/27/2019 CLINICAL HISTORY: Chest pain FINDINGS: LUNGS AND PLEURA: Hyperinflation. No focal infiltrate is noted. No pleural effusion. No  pneumothorax. HEART AND MEDIASTINUM: No acute abnormality of the cardiac and mediastinal silhouettes. BONES AND SOFT TISSUES: Degenerative changes of the spine. IMPRESSION: 1. No acute cardiopulmonary process. Electronically signed by: Oneil Devonshire MD 12/19/2023 09:16 PM EST RP Workstation: [id number]     Assessment and Plan: Erik Shields is a 73 y.o. male with a hx of hypercholesterolemia, SVT, PACs, and tobacco abuse who is being seen 12/20/2023 for the evaluation of chest pain and elevated troponins at the request of Mir Zella MD.  NSTEMI Hyperlipidemia Presented to the emergency department for 4 days of substernal chest pain.  The pain is worse with exertion and better when he rests.  Denies any ongoing pain since arriving at the hospital.  Denies any shortness of breath, orthopnea, lower extremity edema. - high-sensitivity troponin 236 > 277, creatinine of 0.88. - EKG showed normal sinus rhythm with a rate of 72, and  biphasic T waves in leads V2 through V4. The patient had an elevated blood pressure overnight as high as 178/97.  Blood pressure has been improving slightly most recent BP at 8 AM was 140/91. Received 324 mg chewable aspirin  and was started on 81 mg aspirin  daily.  Was also started on IV heparin . Lipid panel and LPA pending. Echocardiogram pending Continue IV heparin  Continue aspirin  81 mg daily Increase Crestor  to 40 mg daily. Continue metoprolol  tartrate 12.5 mg twice daily Plan to transfer patient to [facility] for a left heart cath. Informed Consent   Shared Decision Making/Informed Consent The risks [stroke (1 in 1000), death (1 in 1000), kidney failure [usually temporary] (1 in 500), bleeding (1 in 200), allergic reaction [possibly serious] (1 in 200)], benefits (diagnostic support and management of coronary artery disease) and alternatives of a cardiac catheterization were discussed in detail with Mr. Baswell and he is willing to proceed.      PACs Cardiac  monitor that was ordered by Dr. Santo revealed symptomatic PACs and short runs of SVT.  The patient was started on metoprolol  12.5  mg twice daily. Continue metoprolol  tartrate 12.5 mg twice daily   Tobacco abuse Cannabis abuse  Chews tobacco.  Reported that he takes a capsule that likely contains delta 8 for his back every now and then.  Recommend cessation   Heart murmur - Echo pending   Otherwise management per primary   Risk Assessment/Risk Scores:  TIMI Risk Score for Unstable Angina or Non-ST Elevation MI:   The patient's TIMI risk score is 3, which indicates a 13% risk of all cause mortality, new or recurrent myocardial infarction or need for urgent revascularization in the next 14 days.  For questions or updates, please contact [facility] Please consult [email or web address] for contact info under   Signed, Zane Adams, PA-C  12/20/2023 9:32 AM\  Patient seen and examined, note reviewed with the signed Advanced Practice Provider. I personally reviewed laboratory data, imaging studies and relevant notes. I independently examined the patient and formulated the important aspects of the plan. I have personally discussed the plan with the patient and/or family. Comments or changes to the note/plan are indicated below.  Patient seen examined by his bedside.  His wife was by the bedside when I arrived.  He tells me he still has had some chest discomfort resolving though.  He is concerned about this.  NSTEMI Hyperlipidemia Tobacco use SVT PACs  Agree with the heparin  drip.  Continue this.  He has been started on aspirin  81 mg daily, rosuvastatin  as well as metoprolol  to tartrate.  Agree with the plan for left heart catheterization today.  Informed Consent   Shared Decision Making/Informed Consent The risks [stroke (1 in 1000), death (1 in 1000), kidney failure [usually temporary] (1 in 500), bleeding (1 in 200), allergic reaction [possibly serious] (1 in 200)],  benefits (diagnostic support and management of coronary artery disease) and alternatives of a cardiac catheterization were discussed in detail with Erik Shields and he is willing to proceed.      Echocardiogram pending.  Tobacco cessation advised   Jahzir Strohmeier DO, MS [facility] Attending Cardiologist [facility]  [address] [place], [place] [address] [phone] Website: [email or web address]

## 2023-12-20 NOTE — ED Notes (Signed)
 Pt asked to go to the bathroom. ED staff advised the pt that they would like him to use a urinal instead of walking to the restroom due to him being on a heparin  infusion.

## 2023-12-21 ENCOUNTER — Other Ambulatory Visit (HOSPITAL_COMMUNITY): Payer: Self-pay

## 2023-12-21 ENCOUNTER — Inpatient Hospital Stay (HOSPITAL_COMMUNITY): Payer: Medicare (Managed Care)

## 2023-12-21 ENCOUNTER — Telehealth (HOSPITAL_COMMUNITY): Payer: Self-pay

## 2023-12-21 DIAGNOSIS — I214 Non-ST elevation (NSTEMI) myocardial infarction: Secondary | ICD-10-CM

## 2023-12-21 LAB — ECHOCARDIOGRAM COMPLETE
Area-P 1/2: 3.53 cm2
Height: 69 in
MV M vel: 3.49 m/s
MV Peak grad: 48.7 mmHg
S' Lateral: 3.1 cm
Weight: 2691.38 [oz_av]

## 2023-12-21 LAB — CBC
HCT: 43.9 % (ref 39.0–52.0)
Hemoglobin: 15.2 g/dL (ref 13.0–17.0)
MCH: 32.5 pg (ref 26.0–34.0)
MCHC: 34.6 g/dL (ref 30.0–36.0)
MCV: 94 fL (ref 80.0–100.0)
Platelets: 250 K/uL (ref 150–400)
RBC: 4.67 MIL/uL (ref 4.22–5.81)
RDW: 13.1 % (ref 11.5–15.5)
WBC: 13.1 K/uL — ABNORMAL HIGH (ref 4.0–10.5)
nRBC: 0 % (ref 0.0–0.2)

## 2023-12-21 LAB — BASIC METABOLIC PANEL WITH GFR
Anion gap: 12 (ref 5–15)
BUN: 12 mg/dL (ref 8–23)
CO2: 23 mmol/L (ref 22–32)
Calcium: 9.3 mg/dL (ref 8.9–10.3)
Chloride: 104 mmol/L (ref 98–111)
Creatinine, Ser: 0.97 mg/dL (ref 0.61–1.24)
GFR, Estimated: >60 mL/min (ref >60)
Glucose, Bld: 96 mg/dL (ref 70–99)
Potassium: 3.9 mmol/L (ref 3.5–5.1)
Sodium: 139 mmol/L (ref 135–145)

## 2023-12-21 LAB — HEMOGLOBIN AND HEMATOCRIT, BLOOD
HCT: 42.8 % (ref 39.0–52.0)
Hemoglobin: 14.8 g/dL (ref 13.0–17.0)

## 2023-12-21 MED ORDER — METOPROLOL TARTRATE 25 MG PO TABS
12.5000 mg | ORAL_TABLET | Freq: Two times a day (BID) | ORAL | 0 refills | Status: DC
  Filled 2023-12-21: qty 15, 15d supply, fill #0

## 2023-12-21 MED ORDER — TICAGRELOR 90 MG PO TABS
90.0000 mg | ORAL_TABLET | Freq: Two times a day (BID) | ORAL | 0 refills | Status: DC
  Filled 2023-12-21: qty 60, 30d supply, fill #0

## 2023-12-21 MED ORDER — ASPIRIN 81 MG PO TBEC
81.0000 mg | DELAYED_RELEASE_TABLET | Freq: Every day | ORAL | 0 refills | Status: AC
  Filled 2023-12-21: qty 90, 90d supply, fill #0

## 2023-12-21 MED ORDER — ROSUVASTATIN CALCIUM 40 MG PO TABS
40.0000 mg | ORAL_TABLET | Freq: Every day | ORAL | 0 refills | Status: DC
  Filled 2023-12-21: qty 90, 90d supply, fill #0

## 2023-12-21 NOTE — Progress Notes (Signed)
 Progress Note  Patient Name: Erik Shields Date of Encounter: 12/21/2023  Primary Cardiologist: Stanly DELENA Leavens, MD   Subjective   Patient seen examined at his bedside.  Status post heart catheterization yesterday  Inpatient Medications    Scheduled Meds:  aspirin  EC  81 mg Oral Daily   feeding supplement  237 mL Oral BID BM   metoprolol  tartrate  12.5 mg Oral BID   rosuvastatin   40 mg Oral Daily   sodium chloride  flush  3 mL Intravenous Q12H   ticagrelor   90 mg Oral BID   Continuous Infusions:  sodium chloride      PRN Meds: sodium chloride , acetaminophen , albuterol , morphine  injection, nitroGLYCERIN , ondansetron  (ZOFRAN ) IV, sodium chloride  flush, traZODone    Vital Signs    Vitals:   12/20/23 1641 12/20/23 2018 12/20/23 2308 12/21/23 0535  BP: 114/73 (!) 119/93 116/76 (!) 140/84  Pulse: 64 63 68 73  Resp: 11 11 11 15   Temp: 97.7 F (36.5 C) 97.9 F (36.6 C) 98 F (36.7 C) 97.7 F (36.5 C)  TempSrc:  Oral Oral Oral  SpO2: 98% 99% 99% 99%  Weight:      Height:        Intake/Output Summary (Last 24 hours) at 12/21/2023 0816 Last data filed at 12/20/2023 1715 Gross per 24 hour  Intake 1000 ml  Output --  Net 1000 ml   Filed Weights   12/19/23 2100  Weight: 76.3 kg    Telemetry     - Personally Reviewed  ECG     - Personally Reviewed  Physical Exam    General: Comfortable Head: Atraumatic, normal size  Eyes: PEERLA, EOMI  Neck: Supple, normal JVD Cardiac: Normal S1, S2; RRR; no murmurs, rubs, or gallops Lungs: Clear to auscultation bilaterally Abd: Soft, nontender, no hepatomegaly  Ext: warm, no edema Musculoskeletal: No deformities, BUE and BLE strength normal and equal Skin: Warm and dry, no rashes   Neuro: Alert and oriented to person, place, time, and situation, CNII-XII grossly intact, no focal deficits  Psych: Normal mood and affect   Labs    Chemistry Recent Labs  Lab 12/19/23 2006 12/21/23 0412  NA 141 139  K  3.8 3.9  CL 104 104  CO2 28 23  GLUCOSE 112* 96  BUN 11 12  CREATININE 0.88 0.97  CALCIUM  9.8 9.3  GFRNONAA >60 >60  ANIONGAP 9 12     Hematology Recent Labs  Lab 12/19/23 2006 12/20/23 0546 12/21/23 0412  WBC 13.5* 13.0* 13.1*  RBC 4.20* 4.49 4.67  HGB 14.0 14.7 15.2  HCT 41.1 43.7 43.9  MCV 97.9 97.3 94.0  MCH 33.3 32.7 32.5  MCHC 34.1 33.6 34.6  RDW 13.1 13.2 13.1  PLT 267 263 250    Cardiac EnzymesNo results for input(s): TROPONINI in the last 168 hours. No results for input(s): TROPIPOC in the last 168 hours.   BNPNo results for input(s): BNP, PROBNP in the last 168 hours.   DDimer No results for input(s): DDIMER in the last 168 hours.   Radiology    CARDIAC CATHETERIZATION Result Date: 12/20/2023 1.  Subtotal occlusion of the mid LAD with long segment severe disease extending into the distal vessel, treated with overlapping 2.0 x 26 mm and 2.25 x 30 mm Onyx drug-eluting stents 2.  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  Moderate nonobstructive mid RCA stenosis of 60% 4.  Mild hypokinesis of  the inferoapex with preserved overall LVEF estimated at 55 to 60%, normal LVEDP Recommendations: DAPT with aspirin  and ticagrelor  x 12 months.  Aggressive medical therapy and antianginal treatment.  If patient develops recurrent anginal symptoms, consider diagonal/obtuse marginal PCI versus multivessel CABG if progressive disease occurs.   DG Chest 2 View Result Date: 12/19/2023 EXAM: 2 VIEW(S) XRAY OF THE CHEST 12/19/2023 09:12:00 PM COMPARISON: 12/27/2019 CLINICAL HISTORY: Chest pain FINDINGS: LUNGS AND PLEURA: Hyperinflation. No focal infiltrate is noted. No pleural effusion. No pneumothorax. HEART AND MEDIASTINUM: No acute abnormality of the cardiac and mediastinal silhouettes. BONES AND SOFT TISSUES: Degenerative changes of the spine. IMPRESSION: 1. No acute cardiopulmonary process.  Electronically signed by: Oneil Devonshire MD 12/19/2023 09:16 PM EST RP Workstation: [id number]    Cardiac Studies   Cardiac catheterization  Patient Profile     73 y.o. male hypercholesteremia, SVT, PACs and tobacco use presented with chest pain, now noted multivessel coronary artery disease on cardiac catheterization  Assessment & Plan    Multivessel coronary artery disease Hypercholesteremia Paroxysmal SVT PACs Tobacco use  He is status post heart catheterization yesterday showing evidence of multivessel coronary artery disease.  Plan for now is medical management.  He has been started on dual antiplatelet therapy, continue his beta-blocker.  Optimize antianginal as appropriate.  LDL is 149, should be less than 55 for aggressive primary and secondary prevention.  He is now on Crestor  40 mg.  He will benefit from optimizing lipid-lowering therapy if he does not improve by his follow-up in the outpatient.  Tobacco cessation advised.  Echo pending. If EF normal he can be discharge. He will need cardiac rehab and follow up with out team.   His wife by the bedside. Had questions was able to answer her questions.    For questions or updates, please contact [facility] Please consult [email or web address] for contact info under Cardiology/STEMI.      Signed, Bryann Gentz, DO  12/21/2023, 8:16 AM

## 2023-12-21 NOTE — Progress Notes (Signed)
 Discussed with pt and wife MI, stent, restrictions, Brilinta  importance, tobacco cessation, diet, exercise, NTG, and CRPII. Pt receptive. He was receptive to tips for quitting tobacco however has not started thinking seriously about quitting. Will refer to [facility] CRPII.   Pt to BR after education then plans to walk hall. [phone] Aliene Aris BS, ACSM-CEP 12/21/2023 11:20 AM

## 2023-12-21 NOTE — Plan of Care (Deleted)
 Discharge instructions discussed with patient.  Patient instructed on home medications, restrictions, and follow up appointments. Belongings gathered and sent with patient.  Patients medications picked up at [facility] pharmacy  Patient discharged via wheelchair by this writer.

## 2023-12-21 NOTE — Telephone Encounter (Signed)
 Pharmacy Patient Advocate Encounter  Insurance verification completed.    The patient is insured through [employer]. Patient has Medicare and is not eligible for a copay card, but may be able to apply for patient assistance or Medicare RX Payment Plan (Patient Must reach out to their plan, if eligible for payment plan), if available.    Ran test claim for Brilinta  90mg  and the current 30 day co-pay is $45.   This test claim was processed through [facility]- copay amounts may vary at other pharmacies due to [employer], or as the patient moves through the different stages of their insurance plan.

## 2023-12-21 NOTE — Progress Notes (Signed)
 Echocardiogram 2D Echocardiogram has been performed.  Erik Shields 12/21/2023, 9:47 AM

## 2023-12-21 NOTE — Plan of Care (Signed)
  Problem: Education: Goal: Understanding of cardiac disease, CV risk reduction, and recovery process will improve 12/21/2023 0704 by Harvin Channing LABOR, RN Outcome: Progressing 12/21/2023 0703 by Harvin Channing LABOR, RN Outcome: Progressing   Problem: Activity: Goal: Ability to tolerate increased activity will improve 12/21/2023 0704 by Harvin Channing LABOR, RN Outcome: Progressing 12/21/2023 0703 by Harvin Channing LABOR, RN Outcome: Progressing

## 2023-12-21 NOTE — Discharge Summary (Signed)
 Physician Discharge Summary  Erik Shields FMW:[id number] DOB: 1950/03/25 DOA: 12/19/2023  PCP: [facility]  Admit date: 12/19/2023 Discharge date: 12/21/2023  Time spent: 40 minutes  Recommendations for Outpatient Follow-up:  Follow outpatient CBC/CMP  Attention to BRBPR outpatient - sounded mild, toilet tissue only, sounds c/w hemorrhoidal bleeding - follow repeat H/H outpatient, needs GI follow up Cardiology follow up outpatient    Discharge Diagnoses:  Principal Problem:   NSTEMI (non-ST elevated myocardial infarction) [facility])   Discharge Condition: stable  Diet recommendation: heart healthy   Filed Weights   12/19/23 2100  Weight: 76.3 kg    History of present illness:   Erik Shields is Erik Shields 73 y.o. male with medical history significant for SVT, hyperlipidemia being admitted to the hospital with NSTEMI.   Now s/p LHC with PCI.   Stable for discharge today, see below and previous cards notes for additional details.   Hospital Course:  Assessment and Plan:  NSTEMI S/p LHC with subtotal occlusion of the mid LAD with long segment severe disease extending into the distal vessel, treated with DES.  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  Moderate nonobstructive mid RCA stenosis of 60%.  Mild hypokinesis of inferoapex with preserved overall EF estimated at 55-60%. Cards recommending DAPTx12 months.  Aggressive medical therapy. Echo with EF 55-60%, regional wall motion abnormalities Continue aspirin , brilinta , crestor , metoprolol   Bright Red Blood Per Rectum Blood noted on toilet tissue this AM - sounds consistent with hemorrhoidal bleeding, he notes he's had this before.   Repeat Hb today is stable Needs outpatient GI follow up - return precautions discussed.    Dyslipidemia Crestor   Leukocytosis Mild, follow outpatient   Tobacco Abuse Encourage  cessation     Procedures: LHC 1.  Subtotal occlusion of the mid LAD with long segment severe disease extending into the distal vessel, treated with overlapping 2.0 x 26 mm and 2.25 x 30 mm Onyx drug-eluting stents 2.  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  Moderate nonobstructive mid RCA stenosis of 60% 4.  Mild hypokinesis of the inferoapex with preserved overall LVEF estimated at 55 to 60%, normal LVEDP   Recommendations: DAPT with aspirin  and ticagrelor  x 12 months.  Aggressive medical therapy and antianginal treatment.  If patient develops recurrent anginal symptoms, consider diagonal/obtuse marginal PCI versus multivessel CABG if progressive disease occurs.   Echo IMPRESSIONS     1. Left ventricular ejection fraction, by estimation, is 55 to 60%. The  left ventricle has normal function. The left ventricle demonstrates  regional wall motion abnormalities (see scoring diagram/findings for  description). Left ventricular diastolic  parameters are consistent with Grade I diastolic dysfunction (impaired  relaxation).   2. Right ventricular systolic function is normal. The right ventricular  size is normal.   3. The mitral valve is normal in structure. Mild mitral valve  regurgitation. No evidence of mitral stenosis.   4. The aortic valve was not well visualized. Aortic valve regurgitation  is not visualized. No aortic stenosis is present.   Consultations: cardiology  Discharge Exam: Vitals:   12/21/23 0535 12/21/23 0947  BP: (!) 140/84 118/80  Pulse: 73 87  Resp: 15 (!) 22  Temp: 97.7 F (36.5 C) 97.8 F (36.6 C)  SpO2: 99% 98%   No new complaints  General: No acute distress. Cardiovascular:  RRR Lungs: unlabored S/nt/nd Neurological: Alert and oriented 3. Moves all extremities 4 with equal strength. Cranial nerves II through XII grossly intact. Extremities: No clubbing or cyanosis. No  edema.  Discharge Instructions   Discharge Instructions     Amb Referral to Cardiac Rehabilitation   Complete by: As directed    Diagnosis:  Coronary Stents NSTEMI PTCA     After initial evaluation and assessments completed: Virtual Based Care may be provided alone or in conjunction with Phase 2 Cardiac Rehab based on patient barriers.: Yes   Intensive Cardiac Rehabilitation (ICR) [facility] location only OR Traditional Cardiac Rehabilitation (TCR) *If criteria for ICR are not met will enroll in TCR [facility] only): Yes   Call MD for:  difficulty breathing, headache or visual disturbances   Complete by: As directed    Call MD for:  extreme fatigue   Complete by: As directed    Call MD for:  hives   Complete by: As directed    Call MD for:  persistant dizziness or light-headedness   Complete by: As directed    Call MD for:  persistant nausea and vomiting   Complete by: As directed    Call MD for:  redness, tenderness, or signs of infection (pain, swelling, redness, odor or green/yellow discharge around incision site)   Complete by: As directed    Call MD for:  severe uncontrolled pain   Complete by: As directed    Call MD for:  temperature >100.4   Complete by: As directed    Diet - low sodium heart healthy   Complete by: As directed    Discharge instructions   Complete by: As directed    You were seen for Erik Shields heart attack.    You've had Erik Shields heart catheterization with stent placement.  It's extremely important that you take aspirin  and brilinta  for 12 months.  We've increased your statin to 40 mg Erik Shields day.    You noticed blood when you wiped today.  Your blood counts are stable.  Keep an eye out for worsening bleeding.  This sounds most consistent with hemorrhoidal bleeding to me, but if you have worsening bleeding, dark or tarry stool, lightheadedness, dizziness, chest pain or shortness of breath, you should return to care.  Follow up with gastroenterology regarding this as an outpatient.    Return for new, recurrent, or worsening symptoms.  Please ask your PCP to request records from this hospitalization so they know what was done and what the next steps will be.   Increase activity slowly   Complete by: As directed       Allergies as of 12/21/2023   No Known Allergies      Medication List     TAKE these medications    aspirin  EC 81 MG tablet Take 1 tablet (81 mg total) by mouth daily. Swallow whole. Start taking on: December 22, 2023   metoprolol  tartrate 25 MG tablet Commonly known as: LOPRESSOR  Take 0.5 tablets (12.5 mg total) by mouth 2 (two) times daily. Follow with cardiology for refills outpatient. What changed: additional instructions   oxyCODONE-acetaminophen  10-325 MG tablet Commonly known as: PERCOCET Take 1 tablet by mouth 2 (two) times daily as needed for pain.   rosuvastatin  40 MG tablet Commonly known as: CRESTOR  Take 1 tablet (40 mg total) by mouth daily. Start taking on: December 22, 2023 What changed:  medication strength how much to take   ticagrelor  90 MG Tabs tablet Commonly known as: BRILINTA  Take 1  tablet (90 mg total) by mouth 2 (two) times daily.       No Known Allergies    The results of significant diagnostics from this hospitalization (including imaging, microbiology, ancillary and laboratory) are listed below for reference.    Significant Diagnostic Studies: ECHOCARDIOGRAM COMPLETE Result Date: 12/21/2023    ECHOCARDIOGRAM REPORT   Patient Name:   Erik Shields Date of Exam: 12/21/2023 Medical Rec #:  [id number]          Height:       69.0 in Accession #:    [id number]         Weight:       168.2 lb Date of Birth:  1950-08-15         BSA:          1.919 m Patient Age:    73 years           BP:           117/69 mmHg Patient Gender: M                  HR:           75 bpm. Exam Location:  Inpatient Procedure: 2D Echo, Cardiac Doppler and Color Doppler (Both Spectral and Color            Flow Doppler were utilized  during procedure). Indications:    NSTEMI  History:        Patient has prior history of Echocardiogram examinations, most                 recent 02/01/2020. Acute MI, Abnormal ECG; Arrythmias:PVC and PAC.  Sonographer:    Juliene Rucks Referring Phys: [phone] MICHAEL COOPER  Sonographer Comments: Image acquisition challenging due to respiratory motion. IMPRESSIONS  1. Left ventricular ejection fraction, by estimation, is 55 to 60%. The left ventricle has normal function. The left ventricle demonstrates regional wall motion abnormalities (see scoring diagram/findings for description). Left ventricular diastolic parameters are consistent with Grade I diastolic dysfunction (impaired relaxation).  2. Right ventricular systolic function is normal. The right ventricular size is normal.  3. The mitral valve is normal in structure. Mild mitral valve regurgitation. No evidence of mitral stenosis.  4. The aortic valve was not well visualized. Aortic valve regurgitation is not visualized. No aortic stenosis is present. FINDINGS  Left Ventricle: Left ventricular ejection fraction, by estimation, is 55 to 60%. The left ventricle has normal function. The left ventricle demonstrates regional wall motion abnormalities. The left ventricular internal cavity size was normal in size. There is no left ventricular hypertrophy. Left ventricular diastolic parameters are consistent with Grade I diastolic dysfunction (impaired relaxation).  LV Wall Scoring: The apical septal segment is hypokinetic. The entire anterior wall, entire lateral wall, anterior septum, entire inferior wall, mid inferoseptal segment, basal inferoseptal segment, and apex are normal. Right Ventricle: The right ventricular size is normal. No increase in right ventricular wall thickness. Right ventricular systolic function is normal. Left Atrium: Left atrial size was normal in size. Right Atrium: Right atrial size was normal in size. Pericardium: There is no evidence of  pericardial effusion. Mitral Valve: The mitral valve is normal in structure. Mild mitral valve regurgitation. No evidence of mitral valve stenosis. Tricuspid Valve: The tricuspid valve is normal in structure. Tricuspid valve regurgitation is trivial. Aortic Valve: The aortic valve was not well visualized. Aortic valve regurgitation is not visualized. No aortic stenosis is present. Pulmonic Valve: The pulmonic valve was  not well visualized. Pulmonic valve regurgitation is not visualized. Aorta: The aortic root and ascending aorta are structurally normal, with no evidence of dilitation and the aortic root is normal in size and structure. IAS/Shunts: The interatrial septum was not well visualized.  LEFT VENTRICLE PLAX 2D LVIDd:         4.70 cm   Diastology LVIDs:         3.10 cm   LV e' medial:    6.53 cm/s LV PW:         0.90 cm   LV E/e' medial:  7.9 LV IVS:        0.70 cm   LV e' lateral:   11.50 cm/s LVOT diam:     1.80 cm   LV E/e' lateral: 4.5 LV SV:         47 LV SV Index:   25 LVOT Area:     2.54 cm  RIGHT VENTRICLE          IVC RV Basal diam:  2.80 cm  IVC diam: 1.60 cm RV Mid diam:    2.00 cm LEFT ATRIUM           Index        RIGHT ATRIUM           Index LA diam:      2.80 cm 1.46 cm/m   RA Area:     13.50 cm LA Vol (A4C): 32.6 ml 16.98 ml/m  RA Volume:   33.00 ml  17.19 ml/m  AORTIC VALVE LVOT Vmax:   107.00 cm/s LVOT Vmean:  71.000 cm/s LVOT VTI:    0.186 m  AORTA Ao Root diam: 3.40 cm MITRAL VALVE               TRICUSPID VALVE MV Area (PHT): 3.53 cm    TR Peak grad:   5.2 mmHg MV Decel Time: 215 msec    TR Vmax:        114.00 cm/s MR Peak grad: 48.7 mmHg MR Vmax:      349.00 cm/s  SHUNTS MV E velocity: 51.90 cm/s  Systemic VTI:  0.19 m MV Boomer Winders velocity: 68.90 cm/s  Systemic Diam: 1.80 cm MV E/Aften Lipsey ratio:  0.75 Lonni Nanas MD Electronically signed by Lonni Nanas MD Signature Date/Time: 12/21/2023/12:29:18 PM    Final    CARDIAC CATHETERIZATION Result Date: 12/20/2023 1.  Subtotal  occlusion of the mid LAD with long segment severe disease extending into the distal vessel, treated with overlapping 2.0 x 26 mm and 2.25 x 30 mm Onyx drug-eluting stents 2.  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  Moderate nonobstructive mid RCA stenosis of 60% 4.  Mild hypokinesis of the inferoapex with preserved overall LVEF estimated at 55 to 60%, normal LVEDP Recommendations: DAPT with aspirin  and ticagrelor  x 12 months.  Aggressive medical therapy and antianginal treatment.  If patient develops recurrent anginal symptoms, consider diagonal/obtuse marginal PCI versus multivessel CABG if progressive disease occurs.   DG Chest 2 View Result Date: 12/19/2023 EXAM: 2 VIEW(S) XRAY OF THE CHEST 12/19/2023 09:12:00 PM COMPARISON: 12/27/2019 CLINICAL HISTORY: Chest pain FINDINGS: LUNGS AND PLEURA: Hyperinflation. No focal infiltrate is noted. No pleural effusion. No pneumothorax. HEART AND MEDIASTINUM: No acute abnormality of the cardiac and mediastinal silhouettes. BONES AND SOFT TISSUES: Degenerative changes of the spine. IMPRESSION: 1. No acute cardiopulmonary process. Electronically signed by: Oneil Devonshire MD 12/19/2023  09:16 PM EST RP Workstation: [id number]    Microbiology: No results found for this or any previous visit (from the past 240 hours).   Labs: Basic Metabolic Panel: Recent Labs  Lab 12/19/23 2006 12/21/23 0412  NA 141 139  K 3.8 3.9  CL 104 104  CO2 28 23  GLUCOSE 112* 96  BUN 11 12  CREATININE 0.88 0.97  CALCIUM  9.8 9.3   Liver Function Tests: No results for input(s): AST, ALT, ALKPHOS, BILITOT, PROT, ALBUMIN in the last 168 hours. No results for input(s): LIPASE, AMYLASE in the last 168 hours. No results for input(s): AMMONIA in the last 168 hours. CBC: Recent Labs  Lab 12/19/23 2006 12/20/23 0546 12/21/23 0412 12/21/23 1209  WBC 13.5* 13.0* 13.1*  --   HGB 14.0 14.7  15.2 14.8  HCT 41.1 43.7 43.9 42.8  MCV 97.9 97.3 94.0  --   PLT 267 263 250  --    Cardiac Enzymes: No results for input(s): CKTOTAL, CKMB, CKMBINDEX, TROPONINI in the last 168 hours. BNP: BNP (last 3 results) No results for input(s): BNP in the last 8760 hours.  ProBNP (last 3 results) No results for input(s): PROBNP in the last 8760 hours.  CBG: No results for input(s): GLUCAP in the last 168 hours.     Signed:  Meliton Monte MD.  [facility] 12/21/2023, 2:06 PM

## 2023-12-21 NOTE — Plan of Care (Signed)
   Problem: Education: Goal: Understanding of cardiac disease, CV risk reduction, and recovery process will improve Outcome: Progressing   Problem: Activity: Goal: Ability to tolerate increased activity will improve Outcome: Progressing     

## 2023-12-22 LAB — LIPOPROTEIN A (LPA): Lipoprotein (a): 39.6 nmol/L — ABNORMAL HIGH (ref <75.0)

## 2023-12-26 ENCOUNTER — Telehealth (HOSPITAL_COMMUNITY): Payer: Self-pay

## 2023-12-26 NOTE — Telephone Encounter (Signed)
 Per pt wife pt may be interested in the cardiac rehab, will call pt after he has completed his f/u and been reviewed.

## 2024-01-20 ENCOUNTER — Telehealth: Payer: Self-pay | Admitting: Internal Medicine

## 2024-01-20 ENCOUNTER — Other Ambulatory Visit: Payer: Self-pay | Admitting: *Deleted

## 2024-01-20 MED ORDER — TICAGRELOR 90 MG PO TABS: 90.0000 mg | ORAL_TABLET | Freq: Two times a day (BID) | ORAL | 1 refills | Status: DC

## 2024-01-20 NOTE — Telephone Encounter (Signed)
" °*  STAT* If patient is at the pharmacy, call can be transferred to refill team.   1. Which medications need to be refilled? (please list name of each medication and dose if known) ticagrelor  (BRILINTA ) 90 MG TABS tablet    2. Would you like to learn more about the convenience, safety, & potential cost savings by using the [facility]?    3. Are you open to using the [facility] (Type [facility].  ).   4. Which pharmacy/location (including street and city if local pharmacy) is medication to be sent to?  [id number] - [place], [place] - [address] AT [address]     5. Do they need a 30 day or 90 day supply? 90 day   "

## 2024-01-23 ENCOUNTER — Other Ambulatory Visit: Payer: Self-pay

## 2024-01-23 ENCOUNTER — Other Ambulatory Visit: Payer: Self-pay | Admitting: Internal Medicine

## 2024-01-23 ENCOUNTER — Other Ambulatory Visit (HOSPITAL_COMMUNITY): Payer: Self-pay

## 2024-01-23 MED ORDER — TICAGRELOR 90 MG PO TABS
90.0000 mg | ORAL_TABLET | Freq: Two times a day (BID) | ORAL | 0 refills | Status: AC
  Filled 2024-01-23: qty 60, 30d supply, fill #0

## 2024-01-24 ENCOUNTER — Other Ambulatory Visit (HOSPITAL_COMMUNITY): Payer: Self-pay

## 2024-01-24 NOTE — Progress Notes (Unsigned)
 " Cardiology Office Note   Date:  01/25/2024  ID:  Erik Shields, DOB January 06, 1951, MRN [id number] PCP: [facility] Providers Cardiologist:  Stanly DELENA Leavens, MD     History of Present Illness Erik Shields is a 73 y.o. male with history of CAD s/p NSTEMI (DES 2.0 mm x 26 mm and 2.25 mm x 30 mm to mid LAD), frequent PVC's, and tobacco use.     He initially established care with Dr. Leavens for frequent PVC's and chest pain. Heart monitor 02/07/2020 predominant NSR, 40 beat SVT occurring 11.1 seconds, isolated PAC's (6.9% burden), and rare PVC's (<1% burden). Metoprolol  tartrate 12.5 mg BID initiated. Cardiac MRI 03/07/20; EF 60%, no evidence of prior MI, infiltrative disease, or myocarditis. He was last seen in office 07/17/21 by Rosaline Bane, NP and was doing relatively well from cardiac standpoint.   He reported to ED 12/19/23 as a NSTEMI. Cardiac catheterization performed with overlapping DES 2.0 x 26 mm and 2.25 x 30 mm DES to mid LAD. There was also severe OM 1 and OM 2 disease, moderate nonobstructive mid RCA stenosis of 60%, and mild hypokinesis of interoapex with preserved LVEF at 55-60%. Recommendations of DAPT with aspirin  and ticagrelor  for 12 months, aggressive medical therapy, and if recurrent anginal symptoms can consider diagonal/OM versus multivessel CABG if disease progresses. Last echo 12/21/23 LVEF 55-60%, LV RWMA, grade I DD, RV normal, and mild MR. Discharged 12/21/23 on aspirin , metoprolol , rosuvastatin , and ticagrelor .     He presents today for hospital follow up in the setting of NSTEMI.  He has been taking it easy at home. He stills feel fatigued and is eager to get back to work. He walks his driveway a couple of times weekly without any shortness of breath or chest pain. He did run out of Brilinta  and did not take it from 12/25-12/30. He denies chest pain, shortness of breath, lower extremity edema,  fatigue, palpitations, melena, hematuria, hemoptysis, diaphoresis, weakness, presyncope, syncope, orthopnea, and PND.   ROS: All systems negative unless otherwise indicated in HPI.   Studies Reviewed EKG Interpretation Date/Time:  Wednesday January 25 2024 10:10:19 EST Ventricular Rate:  53 PR Interval:  158 QRS Duration:  86 QT Interval:  434 QTC Calculation: 407 R Axis:   78  Text Interpretation: Sinus bradycardia T wave abnormality, consider anterior ischemia When compared with ECG of 21-Dec-2023 05:38, T wave inversion less evident in Anterior leads QT has shortened Confirmed by Teresa Fish [phone]) on 01/25/2024 10:19:21 AM    Cardiac Studies & Procedures   ______________________________________________________________________________________________ CARDIAC CATHETERIZATION  CARDIAC CATHETERIZATION 12/20/2023  Conclusion 1.  Subtotal occlusion of the mid LAD with long segment severe disease extending into the distal vessel, treated with overlapping 2.0 x 26 mm and 2.25 x 30 mm Onyx drug-eluting stents 2.  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  Moderate nonobstructive mid RCA stenosis of 60% 4.  Mild hypokinesis of the inferoapex with preserved overall LVEF estimated at 55 to 60%, normal LVEDP  Recommendations: DAPT with aspirin  and ticagrelor  x 12 months.  Aggressive medical therapy and antianginal treatment.  If patient develops recurrent anginal symptoms, consider diagonal/obtuse marginal PCI versus multivessel CABG if progressive disease occurs.  Findings Coronary Findings Diagnostic  Dominance: Right  Left Anterior Descending Mid LAD to Dist LAD lesion is 95% stenosed.  First Diagonal Branch 1st Diag lesion is  85% stenosed.  Second Diagonal Branch 2nd Diag lesion is 99% stenosed.  Left Circumflex  First Obtuse Marginal Branch 1st Mrg lesion is 75% stenosed.  Right Coronary  Artery Mid RCA lesion is 60% stenosed.  Intervention  Mid LAD to Dist LAD lesion Stent Lesion crossed with guidewire using a WIRE RUNTHROUGH .[id number]. Pre-stent angioplasty was performed using a BALLOON EMERGE MR 2.0X20. A drug-eluting stent was successfully placed using a STENT ONYX FRONTIER 2.25X30. Post-stent angioplasty was performed using a BALLOON [employer] EMERGE MR 2.5X15. Maximum pressure:  16 atm. Distally, a 2.0 x 26 mm Onyx DES is deployed.  Proximally, a 2.25 x 30 mm Onyx DES is deployed in overlapping fashion. Post-Intervention Lesion Assessment The intervention was successful. Pre-interventional TIMI flow is 3. Post-intervention TIMI flow is 3. No complications occurred at this lesion. There is a 0% residual stenosis post intervention.     ECHOCARDIOGRAM  ECHOCARDIOGRAM COMPLETE 12/21/2023  Narrative ECHOCARDIOGRAM REPORT    Patient Name:   Erik Shields Date of Exam: 12/21/2023 Medical Rec #:  [id number]          Height:       69.0 in Accession #:    [id number]         Weight:       168.2 lb Date of Birth:  07-06-1950         BSA:          1.919 m Patient Age:    73 years           BP:           117/69 mmHg Patient Gender: M                  HR:           75 bpm. Exam Location:  Inpatient  Procedure: 2D Echo, Cardiac Doppler and Color Doppler (Both Spectral and Color Flow Doppler were utilized during procedure).  Indications:    NSTEMI  History:        Patient has prior history of Echocardiogram examinations, most recent 02/01/2020. Acute MI, Abnormal ECG; Arrythmias:PVC and PAC.  Sonographer:    Juliene Rucks Referring Phys: [id number] MICHAEL COOPER   Sonographer Comments: Image acquisition challenging due to respiratory motion. IMPRESSIONS   1. Left ventricular ejection fraction, by estimation, is 55 to 60%. The left ventricle has normal function. The left ventricle demonstrates regional wall motion abnormalities (see scoring diagram/findings for description).  Left ventricular diastolic parameters are consistent with Grade I diastolic dysfunction (impaired relaxation). 2. Right ventricular systolic function is normal. The right ventricular size is normal. 3. The mitral valve is normal in structure. Mild mitral valve regurgitation. No evidence of mitral stenosis. 4. The aortic valve was not well visualized. Aortic valve regurgitation is not visualized. No aortic stenosis is present.  FINDINGS Left Ventricle: Left ventricular ejection fraction, by estimation, is 55 to 60%. The left ventricle has normal function. The left ventricle demonstrates regional wall motion abnormalities. The left ventricular internal cavity size was normal in size. There is no left ventricular hypertrophy. Left ventricular diastolic parameters are consistent with Grade I diastolic dysfunction (impaired relaxation).   LV Wall Scoring: The apical septal segment is hypokinetic. The entire anterior wall, entire lateral wall, anterior septum, entire inferior wall, mid inferoseptal segment, basal inferoseptal segment, and apex are normal.  Right Ventricle: The right ventricular size is normal. No increase in right ventricular wall thickness. Right ventricular systolic function is normal.  Left Atrium: Left  atrial size was normal in size.  Right Atrium: Right atrial size was normal in size.  Pericardium: There is no evidence of pericardial effusion.  Mitral Valve: The mitral valve is normal in structure. Mild mitral valve regurgitation. No evidence of mitral valve stenosis.  Tricuspid Valve: The tricuspid valve is normal in structure. Tricuspid valve regurgitation is trivial.  Aortic Valve: The aortic valve was not well visualized. Aortic valve regurgitation is not visualized. No aortic stenosis is present.  Pulmonic Valve: The pulmonic valve was not well visualized. Pulmonic valve regurgitation is not visualized.  Aorta: The aortic root and ascending aorta are structurally  normal, with no evidence of dilitation and the aortic root is normal in size and structure.  IAS/Shunts: The interatrial septum was not well visualized.   LEFT VENTRICLE PLAX 2D LVIDd:         4.70 cm   Diastology LVIDs:         3.10 cm   LV e' medial:    6.53 cm/s LV PW:         0.90 cm   LV E/e' medial:  7.9 LV IVS:        0.70 cm   LV e' lateral:   11.50 cm/s LVOT diam:     1.80 cm   LV E/e' lateral: 4.5 LV SV:         47 LV SV Index:   25 LVOT Area:     2.54 cm   RIGHT VENTRICLE          IVC RV Basal diam:  2.80 cm  IVC diam: 1.60 cm RV Mid diam:    2.00 cm  LEFT ATRIUM           Index        RIGHT ATRIUM           Index LA diam:      2.80 cm 1.46 cm/m   RA Area:     13.50 cm LA Vol (A4C): 32.6 ml 16.98 ml/m  RA Volume:   33.00 ml  17.19 ml/m AORTIC VALVE LVOT Vmax:   107.00 cm/s LVOT Vmean:  71.000 cm/s LVOT VTI:    0.186 m  AORTA Ao Root diam: 3.40 cm  MITRAL VALVE               TRICUSPID VALVE MV Area (PHT): 3.53 cm    TR Peak grad:   5.2 mmHg MV Decel Time: 215 msec    TR Vmax:        114.00 cm/s MR Peak grad: 48.7 mmHg MR Vmax:      349.00 cm/s  SHUNTS MV E velocity: 51.90 cm/s  Systemic VTI:  0.19 m MV A velocity: 68.90 cm/s  Systemic Diam: 1.80 cm MV E/A ratio:  0.75  Lonni Nanas MD Electronically signed by Lonni Nanas MD Signature Date/Time: 12/21/2023/12:29:18 PM    Final    MONITORS  LONG TERM MONITOR (3-14 DAYS) 02/05/2020  Narrative  Patient had a minimum heart rate of 45 bpm, maximum heart rate of 226 bpm, and average heart rate of 74 bpm.  Predominant underlying rhythm was sinus rhythm.  Forty run of supraventricular tachycardia occurred lasting 11.1 seconds at longest with a max rate of 226 bpm at fastest.  Isolated PACs were frequent (6.8%), with rare couplets and triplets present.  Isolated PVCs were rare (<1.0%), with rare couplets present.  No evidence of complete heart block.  Triggered and diary events  associated with PACs.  Symptomatic PACs with SVT.     CARDIAC MRI  MR CARDIAC MORPHOLOGY W WO CONTRAST 03/07/2020  Narrative CLINICAL DATA:  COVID-19 infection, ?myocarditis  EXAM: CARDIAC MRI  TECHNIQUE: The patient was scanned on a 1.5 Tesla GE magnet. A dedicated cardiac coil was used. Functional imaging was done using Fiesta sequences. 2,3, and 4 chamber views were done to assess for RWMA's. Modified Simpson's rule using a short axis stack was used to calculate an ejection fraction on a dedicated work [employer]. The patient received 8 cc of Gadavist . After 10 minutes inversion recovery sequences were used to assess for infiltration and scar tissue.  FINDINGS: Limited images of the lung fields showed no gross abnormalities.  Normal left ventricular size and wall thickness. Normal wall motion, LV EF 61%. Normal right ventricular size and systolic function, EF 60%. Normal left and right atrial sizes. Trivial mitral regurgitation. Trileaflet aortic valve with no regurgitation or stenosis.  On delayed enhancement imaging there was no myocardial late gadolinium enhancement (LGE) noted.  Measurements:  LVEDV 116 mL LVSV 71 mL LVEF 61%  RVEDV 140 mL RVSV 84 mL RVEF 60%  T2 44 septum, 48 lateral wall.  IMPRESSION: 1.  Normal LV size and wall thickness, EF 61%.  2.  Normal RV size and systolic function, EF 60%.  3. No myocardial LGE, so no definitive evidence for prior MI, infiltrative disease, or myocarditis.  4.  Normal T2 signal  Engineer, Maintenance (it) Signed By: Ezra Shuck M.D. On: 03/07/2020 17:36   ______________________________________________________________________________________________      Risk Assessment/Calculations           Physical Exam VS:  BP 128/76 (BP Location: Left Arm, Patient Position: Sitting, Cuff Size: Normal)   Pulse (!) 53   Ht 5' 8 (1.727 m)   Wt 152 lb 12.8 oz (69.3 kg)   SpO2 99%    BMI 23.23 kg/m        Wt Readings from Last 3 Encounters:  01/25/24 152 lb 12.8 oz (69.3 kg)  12/19/23 168 lb 3.4 oz (76.3 kg)  07/17/21 168 lb 3.2 oz (76.3 kg)    GEN: Well nourished, well developed in no acute distress NECK: No JVD; No carotid bruits CARDIAC: RRR, no murmurs, rubs, gallops RESPIRATORY:  Clear to auscultation without rales, wheezing or rhonchi  ABDOMEN: Soft, non-tender, non-distended EXTREMITIES:  No edema; No deformity   ASSESSMENT AND PLAN  CAD s/p NSTEMI- Cardiac catheterization performed 12/20/23 with overlapping DES 2.0 x 26 mm and 2.25 x 30 mm DES to mid LAD. Severe OM 1 and OM 2 disease, moderate nonobstructive mid RCA stenosis of 60%, and mild hypokinesis of interoapex with preserved LVEF at 55-60%. DAPT recommended for 12 months. EKG today NSR with improved T wave inversion in anterior leads. He did note missing 5 days of Brilinta  and was educated on the importance. He is reluctant to participate in cardiac rehab, and agrees to consider. He denies CP, SOB, DOE, and LE edema. Right radial cath site is without ecchymosis, swelling, and has strong pulse. Reassured him that fatigue is likely d/t extent of MI, and should improve overtime. Continue aspirin  81 mg, metoprolol  tartrate 25 mg BID, rosuvastatin  40 mg, and ticagrelor  90 mg.   HLD LDL goal <55- Last LDL 149 11/25/2023. Lpa 39.6 12/20/23. Continue rosuvastatin  40 mg.Will repeat lipid panel and LFT's at next visit. Heart healthy diet and regular cardiovascular exercise encouraged.   Frequent PVC's- EKG today NSR. He denies  palpitations, dizziness, and lightheadedness. Continue metoprolol  tartrate 25 mg BID.   Tobacco use- Chews tobacco. Cessation encouraged.    Cardiac Rehabilitation Eligibility Assessment  The patient is ready to start cardiac rehabilitation from a cardiac standpoint.       Dispo: Follow up with APP in 4 weeks and Dr. Santo in 3-4 months.  Signed, Mardy KATHEE Pizza, FNP  "

## 2024-01-25 ENCOUNTER — Ambulatory Visit: Payer: Medicare (Managed Care)

## 2024-01-25 ENCOUNTER — Encounter: Payer: Self-pay | Admitting: Physician Assistant

## 2024-01-25 VITALS — BP 128/76 | HR 53 | Ht 68.0 in | Wt 152.8 lb

## 2024-01-25 DIAGNOSIS — I493 Ventricular premature depolarization: Secondary | ICD-10-CM | POA: Diagnosis not present

## 2024-01-25 DIAGNOSIS — I2510 Atherosclerotic heart disease of native coronary artery without angina pectoris: Secondary | ICD-10-CM

## 2024-01-25 DIAGNOSIS — Z720 Tobacco use: Secondary | ICD-10-CM | POA: Diagnosis not present

## 2024-01-25 DIAGNOSIS — E785 Hyperlipidemia, unspecified: Secondary | ICD-10-CM | POA: Diagnosis not present

## 2024-01-25 MED ORDER — ROSUVASTATIN CALCIUM 40 MG PO TABS: 40.0000 mg | ORAL_TABLET | Freq: Every day | ORAL | 3 refills | Status: AC

## 2024-01-25 MED ORDER — METOPROLOL TARTRATE 25 MG PO TABS: 12.5000 mg | ORAL_TABLET | Freq: Two times a day (BID) | ORAL | 3 refills | Status: AC

## 2024-01-25 NOTE — Patient Instructions (Signed)
 Medication Instructions:  NO CHANGES *If you need a refill on your cardiac medications before your next appointment, please call your pharmacy*  Lab Work: NO LABS If you have labs (blood work) drawn today and your tests are completely normal, you will receive your results only by: MyChart Message (if you have MyChart) OR A paper copy in the mail If you have any lab test that is abnormal or we need to change your treatment, we will call you to review the results.  Testing/Procedures: NO TESTING  Follow-Up: At [facility] you and your health needs are our priority.  As part of our continuing mission to provide you with exceptional heart care, our providers are all part of one team.  This team includes your primary Cardiologist (physician) and Advanced Practice Providers or APPs (Physician Assistants and Nurse Practitioners) who all work together to provide you with the care you need, when you need it.  Your next appointment:   4 week(s)  Provider:   Scot Ford, PA-C      Then, Stanly DELENA Leavens, MD will plan to see you again in 3-4 month(s).   We recommend signing up for the patient portal called MyChart.  Sign up information is provided on this After Visit Summary.  MyChart is used to connect with patients for Virtual Visits (Telemedicine).  Patients are able to view lab/test results, encounter notes, upcoming appointments, etc.  Non-urgent messages can be sent to your provider as well.   To learn more about what you can do with MyChart, go to [email or web address].   Other Instructions COME FASTING TO 4 WEEK FOLLOW UP , LABS WILL BE DRAWN.

## 2024-01-28 ENCOUNTER — Other Ambulatory Visit (HOSPITAL_COMMUNITY): Payer: Self-pay

## 2024-03-01 ENCOUNTER — Encounter: Payer: Self-pay | Admitting: Physician Assistant

## 2024-03-01 ENCOUNTER — Ambulatory Visit: Payer: Medicare (Managed Care)

## 2024-03-01 VITALS — BP 118/72 | HR 60 | Ht 69.0 in | Wt 148.0 lb

## 2024-03-01 DIAGNOSIS — Z720 Tobacco use: Secondary | ICD-10-CM

## 2024-03-01 DIAGNOSIS — E785 Hyperlipidemia, unspecified: Secondary | ICD-10-CM

## 2024-03-01 DIAGNOSIS — I2510 Atherosclerotic heart disease of native coronary artery without angina pectoris: Secondary | ICD-10-CM

## 2024-03-01 DIAGNOSIS — I493 Ventricular premature depolarization: Secondary | ICD-10-CM

## 2024-03-01 NOTE — Patient Instructions (Addendum)
 Medication Instructions:  Your physician recommends that you continue on your current medications as directed. Please refer to the Current Medication list given to you today.  *If you need a refill on your cardiac medications before your next appointment, please call your pharmacy*  Lab Work: (TODAY) Lab Orders         CBC         LDL cholesterol, direct         Comp Met (CMET)     If you have labs (blood work) drawn today and your tests are completely normal, you will receive your results only by: MyChart Message (if you have MyChart) OR A paper copy in the mail If you have any lab test that is abnormal or we need to change your treatment, we will call you to review the results.    Follow-Up: At [facility], you and your health needs are our priority.  As part of our continuing mission to provide you with exceptional heart care, our providers are all part of one team.  This team includes your primary Cardiologist (physician) and Advanced Practice Providers or APPs (Physician Assistants and Nurse Practitioners) who all work together to provide you with the care you need, when you need it.  Your next appointment:    TAMMY** May 22, 2024 @ 9:40am  Provider:   Stanly DELENA Leavens, MD

## 2024-03-02 LAB — CBC
Hematocrit: 37.8 % (ref 37.5–51.0)
Hemoglobin: 12.4 g/dL — ABNORMAL LOW (ref 13.0–17.7)
MCH: 32.7 pg (ref 26.6–33.0)
MCHC: 32.8 g/dL (ref 31.5–35.7)
MCV: 100 fL — ABNORMAL HIGH (ref 79–97)
Platelets: 208 10*3/uL (ref 150–450)
RBC: 3.79 x10E6/uL — ABNORMAL LOW (ref 4.14–5.80)
RDW: 14.3 % (ref 11.6–15.4)
WBC: 7 10*3/uL (ref 3.4–10.8)

## 2024-03-02 LAB — COMPREHENSIVE METABOLIC PANEL WITH GFR
ALT: 23 [IU]/L (ref 0–44)
AST: 33 [IU]/L (ref 0–40)
Albumin: 4.3 g/dL (ref 3.8–4.8)
Alkaline Phosphatase: 79 [IU]/L (ref 47–123)
BUN/Creatinine Ratio: 16 (ref 10–24)
BUN: 16 mg/dL (ref 8–27)
Bilirubin Total: 0.8 mg/dL (ref 0.0–1.2)
CO2: 24 mmol/L (ref 20–29)
Calcium: 9.1 mg/dL (ref 8.6–10.2)
Chloride: 106 mmol/L (ref 96–106)
Creatinine, Ser: 0.98 mg/dL (ref 0.76–1.27)
Globulin, Total: 1.8 g/dL (ref 1.5–4.5)
Glucose: 84 mg/dL (ref 70–99)
Potassium: 4.7 mmol/L (ref 3.5–5.2)
Sodium: 143 mmol/L (ref 134–144)
Total Protein: 6.1 g/dL (ref 6.0–8.5)
eGFR: 81 mL/min/{1.73_m2}

## 2024-03-02 LAB — LDL CHOLESTEROL, DIRECT: LDL Direct: 33 mg/dL (ref 0–99)

## 2024-05-22 ENCOUNTER — Ambulatory Visit: Payer: Medicare (Managed Care) | Admitting: Internal Medicine
